# Patient Record
Sex: Male | Born: 1995
Health system: Southern US, Community
[De-identification: ages and names within clinical notes are randomized; demographics above are authoritative.]

---
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
## 2017-09-24 ENCOUNTER — Encounter (HOSPITAL_COMMUNITY): Payer: Self-pay | Admitting: Family Medicine

## 2017-09-24 ENCOUNTER — Emergency Department (HOSPITAL_COMMUNITY)
Admission: EM | Admit: 2017-09-24 | Discharge: 2017-09-24 | Disposition: A | Discharge status: Home / Self Care | Payer: Self-pay | Attending: Emergency Medicine | Admitting: Emergency Medicine

## 2017-09-24 DIAGNOSIS — F17210 Nicotine dependence, cigarettes, uncomplicated: Secondary | ICD-10-CM | POA: Insufficient documentation

## 2017-09-24 DIAGNOSIS — F1210 Cannabis abuse, uncomplicated: Secondary | ICD-10-CM | POA: Insufficient documentation

## 2017-09-24 DIAGNOSIS — F919 Conduct disorder, unspecified: Secondary | ICD-10-CM

## 2017-09-24 DIAGNOSIS — F989 Unspecified behavioral and emotional disorders with onset usually occurring in childhood and adolescence: Secondary | ICD-10-CM | POA: Insufficient documentation

## 2017-09-24 LAB — RAPID URINE DRUG SCREEN, HOSP PERFORMED
AMPHETAMINES: NOT DETECTED
BARBITURATES: NOT DETECTED
Benzodiazepines: NOT DETECTED
Cocaine: NOT DETECTED
OPIATES: NOT DETECTED
Tetrahydrocannabinol: POSITIVE — AB

## 2017-09-24 LAB — CBG MONITORING, ED: Glucose-Capillary: 81 mg/dL (ref 65–99)

## 2017-09-24 NOTE — Discharge Instructions (Addendum)
Follow-up with community mental health resources.  Avoid smoking marijuana.

## 2017-09-24 NOTE — ED Provider Notes (Addendum)
[facility]-EMERGENCY DEPT Provider Note   CSN: [id number] Arrival date & time: 09/24/17  1903     History   Chief Complaint Chief Complaint  Patient presents with  . Psychiatric Evaluation    HPI Brett Wu is a 22 y.o. male.  Patient reports excessive marijuana smoking recently.  He thinks that someone is laced his wheezing.  His brother states he is acting differently than usual.  No suicidal homicidal ideation noted but he has heard it can be inflated appropriately and possibly seeing things.  No previous psychiatric disorder.  He is normally healthy.  He denies other street drugs other than marijuana.  He does not appear to be actively psychotic.     History reviewed. No pertinent past medical history.  Patient Active Problem List   Diagnosis Date Noted  . Cannabis abuse with other cannabis-induced disorder (HCC) 09/27/2017  . Aggressive behavior     History reviewed. No pertinent surgical history.      Home Medications    Prior to Admission medications   Not on File    Family History History reviewed. No pertinent family history.  Social History Social History   Tobacco Use  . Smoking status: Current Every Day Smoker    Types: Cigarettes  . Smokeless tobacco: Never Used  . Tobacco comment: " A lot"   Substance Use Topics  . Alcohol use: Yes    Comment: 2-3 times a week. Last drink: today   . Drug use: Yes    Types: Marijuana    Comment: Last used: 1 week ago      Allergies   Patient has no known allergies.   Review of Systems Review of Systems  All other systems reviewed and are negative.    Physical Exam Updated Vital Signs BP 103/68 (BP Location: Left Arm)   Pulse (!) 56   Temp 97.6 F (36.4 C) (Oral)   Resp 16   Ht  (1.727 m)   Wt 72.6 kg (160 lb)   SpO2 99%   BMI 24.33 kg/m   Physical Exam  Constitutional: He is oriented to person, place, and time. He appears well-developed and  well-nourished.  HENT:  Head: Normocephalic and atraumatic.  Eyes: Conjunctivae are normal.  Neck: Neck supple.  Cardiovascular: Normal rate and regular rhythm.  Pulmonary/Chest: Effort normal and breath sounds normal.  Abdominal: Soft. Bowel sounds are normal.  Musculoskeletal: Normal range of motion.  Neurological: He is alert and oriented to person, place, and time.  Skin: Skin is warm and dry.  Psychiatric:  Flat affect; no suicidal or homicidal ideation; no obvious psychosis  Nursing note and vitals reviewed.    ED Treatments / Results  Labs (all labs ordered are listed, but only abnormal results are displayed) Labs Reviewed  RAPID URINE DRUG SCREEN, HOSP PERFORMED - Abnormal; Notable for the following components:      Result Value   Tetrahydrocannabinol POSITIVE (*)    All other components within normal limits  CBG MONITORING, ED    EKG None  Radiology No results found.  Procedures Procedures (including critical care time)  Medications Ordered in ED Medications - No data to display   Initial Impression / Assessment and Plan / ED Course  I have reviewed the triage vital signs and the nursing notes.  Pertinent labs & imaging results that were available during my care of the patient were reviewed by me and considered in my medical decision making (see chart for details).  Patient presents with altered thinking suspect secondary to marijuana smoking.  No other street drugs in his drug screen.  Glucose normal.  Behavioral Health consult obtained.  It was their opinion that patient to be treated as an outpatient.  Brother will take him home.   Final Clinical Impressions(s) / ED Diagnoses   Final diagnoses:  Behavior disturbance    ED Discharge Orders    None       Donnetta Hutching, MD 09/24/17 2223    Donnetta Hutching, MD 10/08/17 [phone]
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
## 2017-09-24 NOTE — ED Notes (Signed)
Pt from home brought to ED by older sibling d/t increased paranoia . Pt admits to smoking "pot" recently and is feeling increasingly paranoid. Denies suicidal or homcidal ideation as well as denies AV hallucinations.
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
## 2017-09-26 ENCOUNTER — Emergency Department (HOSPITAL_COMMUNITY)
Admission: EM | Admit: 2017-09-26 | Discharge: 2017-09-27 | Disposition: A | Discharge status: Home / Self Care | Payer: Self-pay | Attending: Emergency Medicine | Admitting: Emergency Medicine

## 2017-09-26 ENCOUNTER — Encounter (HOSPITAL_COMMUNITY): Payer: Self-pay | Admitting: Emergency Medicine

## 2017-09-26 DIAGNOSIS — F22 Delusional disorders: Secondary | ICD-10-CM

## 2017-09-26 DIAGNOSIS — F12188 Cannabis abuse with other cannabis-induced disorder: Secondary | ICD-10-CM

## 2017-09-26 DIAGNOSIS — R4689 Other symptoms and signs involving appearance and behavior: Secondary | ICD-10-CM

## 2017-09-26 DIAGNOSIS — F17210 Nicotine dependence, cigarettes, uncomplicated: Secondary | ICD-10-CM | POA: Insufficient documentation

## 2017-09-26 LAB — CBC WITH DIFFERENTIAL/PLATELET
Basophils Absolute: 0 10*3/uL (ref 0.0–0.1)
Basophils Relative: 1 %
EOS PCT: 1 %
Eosinophils Absolute: 0.1 10*3/uL (ref 0.0–0.7)
HCT: 44.6 % (ref 39.0–52.0)
Hemoglobin: 16.4 g/dL (ref 13.0–17.0)
LYMPHS ABS: 1.6 10*3/uL (ref 0.7–4.0)
LYMPHS PCT: 27 %
MCH: 30.9 pg (ref 26.0–34.0)
MCHC: 36.8 g/dL — AB (ref 30.0–36.0)
MCV: 84.2 fL (ref 78.0–100.0)
MONOS PCT: 10 %
Monocytes Absolute: 0.6 10*3/uL (ref 0.1–1.0)
Neutro Abs: 3.6 10*3/uL (ref 1.7–7.7)
Neutrophils Relative %: 61 %
PLATELETS: 191 10*3/uL (ref 150–400)
RBC: 5.3 MIL/uL (ref 4.22–5.81)
RDW: 12.8 % (ref 11.5–15.5)
WBC: 5.9 10*3/uL (ref 4.0–10.5)

## 2017-09-26 LAB — COMPREHENSIVE METABOLIC PANEL
ALBUMIN: 4.8 g/dL (ref 3.5–5.0)
ALK PHOS: 81 U/L (ref 38–126)
ALT: 15 U/L — ABNORMAL LOW (ref 17–63)
AST: 14 U/L — AB (ref 15–41)
Anion gap: 8 (ref 5–15)
BILIRUBIN TOTAL: 1.3 mg/dL — AB (ref 0.3–1.2)
BUN: 13 mg/dL (ref 6–20)
CALCIUM: 9.5 mg/dL (ref 8.9–10.3)
CO2: 28 mmol/L (ref 22–32)
Chloride: 105 mmol/L (ref 101–111)
Creatinine, Ser: 0.83 mg/dL (ref 0.61–1.24)
GFR calc Af Amer: >60 mL/min (ref >60)
GFR calc non Af Amer: >60 mL/min (ref >60)
GLUCOSE: 83 mg/dL (ref 65–99)
Potassium: 3.9 mmol/L (ref 3.5–5.1)
Sodium: 141 mmol/L (ref 135–145)
TOTAL PROTEIN: 6.9 g/dL (ref 6.5–8.1)

## 2017-09-26 LAB — RAPID URINE DRUG SCREEN, HOSP PERFORMED
Amphetamines: NOT DETECTED
Barbiturates: NOT DETECTED
Benzodiazepines: NOT DETECTED
Cocaine: NOT DETECTED
Opiates: NOT DETECTED
TETRAHYDROCANNABINOL: POSITIVE — AB

## 2017-09-26 LAB — ETHANOL: Alcohol, Ethyl (B): <10 mg/dL (ref <10)

## 2017-09-26 LAB — SALICYLATE LEVEL: Salicylate Lvl: <7 mg/dL (ref 2.8–30.0)

## 2017-09-26 LAB — ACETAMINOPHEN LEVEL: Acetaminophen (Tylenol), Serum: <10 ug/mL — ABNORMAL LOW (ref 10–30)
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
## 2017-09-26 NOTE — ED Notes (Signed)
Pts brother sts that the father is not a reliable person to contact in case of any emergency and that in case of emergency Brother [place] would like to be called.

## 2017-09-26 NOTE — ED Notes (Signed)
Pt currently sleeping; respirations regular and unlabored. No distress noted.

## 2017-09-26 NOTE — ED Notes (Signed)
Bed: [facility] Expected date:  Expected time:  Means of arrival:  Comments: Hold for [employer]
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
## 2017-09-27 DIAGNOSIS — R4689 Other symptoms and signs involving appearance and behavior: Secondary | ICD-10-CM | POA: Insufficient documentation

## 2017-09-27 DIAGNOSIS — F12188 Cannabis abuse with other cannabis-induced disorder: Secondary | ICD-10-CM | POA: Diagnosis present

## 2017-09-27 DIAGNOSIS — F17210 Nicotine dependence, cigarettes, uncomplicated: Secondary | ICD-10-CM

## 2017-09-27 NOTE — ED Notes (Signed)
Pt discharged home. Discharged instructions read to pt who verbalized understanding. All belongings returned to pt who signed for same. Denies SI/HI, is not delusional and not responding to internal stimuli. Escorted pt to the ED exit.    

## 2017-09-27 NOTE — [facility]HH Suicide Risk Assessment (Signed)
Suicide Risk Assessment  Discharge Assessment   [facility] Discharge Suicide Risk Assessment   Principal Problem: Cannabis abuse with other cannabis-induced disorder [facility]) Discharge Diagnoses:  Patient Active Problem List   Diagnosis Date Noted  . Cannabis abuse with other cannabis-induced disorder [facility]) [F12.188] 09/27/2017    Priority: High  . Aggressive behavior [R46.89]     Total Time spent with patient: 45 minutes  Musculoskeletal: Strength & Muscle Tone: within normal limits Gait & Station: normal Patient leans: N/A  Psychiatric Specialty Exam: Physical Exam  Constitutional: He is oriented to person, place, and time. He appears well-developed and well-nourished.  HENT:  Head: Normocephalic.  Neck: Normal range of motion.  Respiratory: Effort normal.  Musculoskeletal: Normal range of motion.  Neurological: He is alert and oriented to person, place, and time.  Psychiatric: He has a normal mood and affect. His speech is normal and behavior is normal. Judgment and thought content normal. Cognition and memory are normal.    Review of Systems  Psychiatric/Behavioral: Positive for substance abuse.  All other systems reviewed and are negative.   Blood pressure (!) 126/56, pulse 67, temperature 98.3 F (36.8 C), temperature source Oral, resp. rate 18, SpO2 100 %.There is no height or weight on file to calculate BMI.  General Appearance: Casual  Eye Contact:  Good  Speech:  Normal Rate  Volume:  Normal  Mood:  Euthymic  Affect:  Congruent  Thought Process:  Coherent and Descriptions of Associations: Intact  Orientation:  Full (Time, Place, and Person)  Thought Content:  WDL and Logical  Suicidal Thoughts:  No  Homicidal Thoughts:  No  Memory:  Immediate;   Good Recent;   Good Remote;   Good  Judgement:  Fair  Insight:  Fair  Psychomotor Activity:  Normal  Concentration:  Concentration: Good and Attention Span: Good  Recall:  Good  Fund of Knowledge:  Good  Language:   Fair  Akathisia:  No  Handed:  Right  AIMS (if indicated):     Assets:  Leisure Time Physical Health Resilience Social Support Vocational/Educational  ADL's:  Intact  Cognition:  WNL  Sleep:      Mental Status Per Nursing Assessment::   On Admission:   cannabis abuse with aggressin  Demographic Factors:  Male, Adolescent or young adult and Caucasian  Loss Factors: NA  Historical Factors: NA  Risk Reduction Factors:   Sense of responsibility to family, Living with another person, especially a relative and Positive social support  Continued Clinical Symptoms:  None  Cognitive Features That Contribute To Risk:  None    Suicide Risk:  Minimal: No identifiable suicidal ideation.  Patients presenting with no risk factors but with morbid ruminations; may be classified as minimal risk based on the severity of the depressive symptoms    Plan Of Care/Follow-up recommendations:  Activity:  as tolerated Diet:  heart healthy diet  Gibran Veselka, Catha Nottingham, NP 09/27/2017, 4:43 PM
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
## 2019-04-30 ENCOUNTER — Encounter (HOSPITAL_COMMUNITY): Payer: Self-pay

## 2019-04-30 ENCOUNTER — Other Ambulatory Visit: Payer: Self-pay

## 2019-04-30 ENCOUNTER — Emergency Department (HOSPITAL_COMMUNITY)
Admission: EM | Admit: 2019-04-30 | Discharge: 2019-05-02 | Disposition: A | Discharge status: Other Acute Inpatient Hospital | Payer: Self-pay | Attending: Emergency Medicine | Admitting: Emergency Medicine

## 2019-04-30 DIAGNOSIS — Z20828 Contact with and (suspected) exposure to other viral communicable diseases: Secondary | ICD-10-CM | POA: Insufficient documentation

## 2019-04-30 DIAGNOSIS — F1914 Other psychoactive substance abuse with psychoactive substance-induced mood disorder: Secondary | ICD-10-CM | POA: Insufficient documentation

## 2019-04-30 DIAGNOSIS — F17210 Nicotine dependence, cigarettes, uncomplicated: Secondary | ICD-10-CM | POA: Insufficient documentation

## 2019-04-30 DIAGNOSIS — F22 Delusional disorders: Secondary | ICD-10-CM | POA: Insufficient documentation

## 2019-04-30 LAB — COMPREHENSIVE METABOLIC PANEL
ALT: 62 U/L — ABNORMAL HIGH (ref 0–44)
AST: 55 U/L — ABNORMAL HIGH (ref 15–41)
Albumin: 5 g/dL (ref 3.5–5.0)
Alkaline Phosphatase: 84 U/L (ref 38–126)
Anion gap: 19 — ABNORMAL HIGH (ref 5–15)
BUN: 11 mg/dL (ref 6–20)
CO2: 21 mmol/L — ABNORMAL LOW (ref 22–32)
Calcium: 9.7 mg/dL (ref 8.9–10.3)
Chloride: 99 mmol/L (ref 98–111)
Creatinine, Ser: 0.92 mg/dL (ref 0.61–1.24)
GFR calc Af Amer: >60 mL/min (ref >60)
GFR calc non Af Amer: >60 mL/min (ref >60)
Glucose, Bld: 113 mg/dL — ABNORMAL HIGH (ref 70–99)
Potassium: 3.2 mmol/L — ABNORMAL LOW (ref 3.5–5.1)
Sodium: 139 mmol/L (ref 135–145)
Total Bilirubin: 1.8 mg/dL — ABNORMAL HIGH (ref 0.3–1.2)
Total Protein: 7.9 g/dL (ref 6.5–8.1)

## 2019-04-30 LAB — CBC WITH DIFFERENTIAL/PLATELET
Abs Immature Granulocytes: 0.06 10*3/uL (ref 0.00–0.07)
Basophils Absolute: 0.1 10*3/uL (ref 0.0–0.1)
Basophils Relative: 1 %
Eosinophils Absolute: 0.1 10*3/uL (ref 0.0–0.5)
Eosinophils Relative: 0 %
HCT: 48.2 % (ref 39.0–52.0)
Hemoglobin: 16.6 g/dL (ref 13.0–17.0)
Immature Granulocytes: 0 %
Lymphocytes Relative: 16 %
Lymphs Abs: 2.4 10*3/uL (ref 0.7–4.0)
MCH: 29.3 pg (ref 26.0–34.0)
MCHC: 34.4 g/dL (ref 30.0–36.0)
MCV: 85.2 fL (ref 80.0–100.0)
Monocytes Absolute: 1.5 10*3/uL — ABNORMAL HIGH (ref 0.1–1.0)
Monocytes Relative: 10 %
Neutro Abs: 10.7 10*3/uL — ABNORMAL HIGH (ref 1.7–7.7)
Neutrophils Relative %: 73 %
Platelets: 277 10*3/uL (ref 150–400)
RBC: 5.66 MIL/uL (ref 4.22–5.81)
RDW: 13 % (ref 11.5–15.5)
WBC: 14.9 10*3/uL — ABNORMAL HIGH (ref 4.0–10.5)
nRBC: 0 % (ref 0.0–0.2)

## 2019-04-30 LAB — RESPIRATORY PANEL BY RT PCR (FLU A&B, COVID)
Influenza A by PCR: NEGATIVE
Influenza B by PCR: NEGATIVE
SARS Coronavirus 2 by RT PCR: NEGATIVE

## 2019-04-30 LAB — RAPID URINE DRUG SCREEN, HOSP PERFORMED
Amphetamines: POSITIVE — AB
Barbiturates: NOT DETECTED
Benzodiazepines: NOT DETECTED
Cocaine: NOT DETECTED
Opiates: NOT DETECTED
Tetrahydrocannabinol: POSITIVE — AB

## 2019-04-30 LAB — ETHANOL: Alcohol, Ethyl (B): <10 mg/dL (ref <10)

## 2019-04-30 MED ORDER — ZIPRASIDONE MESYLATE 20 MG IM SOLR
20.0000 mg | Freq: Once | INTRAMUSCULAR | Status: AC
  Administered 2019-04-30: 02:00:00 20 mg via INTRAMUSCULAR
  Filled 2019-04-30: qty 20

## 2019-04-30 MED ORDER — OLANZAPINE 5 MG PO TBDP: 5.0000 mg | ORAL_TABLET | Freq: Once | ORAL | Status: DC

## 2019-04-30 MED ORDER — ZIPRASIDONE MESYLATE 20 MG IM SOLR
20.0000 mg | Freq: Once | INTRAMUSCULAR | Status: AC
  Administered 2019-04-30: 09:00:00 20 mg via INTRAMUSCULAR
  Filled 2019-04-30: qty 20

## 2019-04-30 MED ORDER — IBUPROFEN 200 MG PO TABS
600.0000 mg | ORAL_TABLET | Freq: Four times a day (QID) | ORAL | Status: DC | PRN
  Administered 2019-04-30 – 2019-05-02 (×2): 600 mg via ORAL
  Filled 2019-04-30 (×2): qty 3

## 2019-04-30 MED ORDER — OLANZAPINE 10 MG IM SOLR
10.0000 mg | Freq: Once | INTRAMUSCULAR | Status: AC
  Administered 2019-04-30: 21:00:00 10 mg via INTRAMUSCULAR
  Filled 2019-04-30: qty 10

## 2019-04-30 MED ORDER — POTASSIUM CHLORIDE CRYS ER 20 MEQ PO TBCR
40.0000 meq | EXTENDED_RELEASE_TABLET | Freq: Once | ORAL | Status: AC
  Administered 2019-04-30: 09:00:00 40 meq via ORAL
  Filled 2019-04-30: qty 2

## 2019-04-30 MED ORDER — STERILE WATER FOR INJECTION IJ SOLN
INTRAMUSCULAR | Status: AC
  Administered 2019-04-30: 10 mL
  Filled 2019-04-30: qty 10

## 2019-04-30 MED ORDER — OLANZAPINE 10 MG PO TBDP
10.0000 mg | ORAL_TABLET | Freq: Once | ORAL | Status: AC
  Administered 2019-04-30: 10:00:00 10 mg via ORAL
  Filled 2019-04-30: qty 1

## 2019-04-30 MED ORDER — DIPHENHYDRAMINE HCL 50 MG/ML IJ SOLN
25.0000 mg | Freq: Once | INTRAMUSCULAR | Status: AC
  Administered 2019-04-30: 21:00:00 25 mg via INTRAMUSCULAR
  Filled 2019-04-30: qty 1

## 2019-04-30 MED ORDER — STERILE WATER FOR INJECTION IJ SOLN
INTRAMUSCULAR | Status: AC
  Filled 2019-04-30: qty 10

## 2019-04-30 NOTE — BH Assessment (Addendum)
[facility] Assessment Progress Note  Per Letitia Libra, FNP, this pt requires psychiatric hospitalization at this time.  Pt presents under IVC initiated by law enforcement, which EDP Addison Lank, MD has upheld.  The following facilities have been contacted to seek placement for this pt, with results as noted:  Beds available, information sent, decision pending: [facility]  At capacity: [place], [place] Coordinator [phone]

## 2019-04-30 NOTE — ED Notes (Signed)
Pt here with police. Pt refusing triage or vitals. IVC papers en route per [employer].

## 2019-04-30 NOTE — ED Notes (Signed)
Pt sleeping comfortably.

## 2019-04-30 NOTE — ED Notes (Signed)
Pt is continuously being very inappropriate, yelling and cursing out staff.

## 2019-04-30 NOTE — ED Notes (Signed)
Pt awake,  Irritated . Delusional, continues to talk about brother being killed. , wants police to go to grandmother house, pt redirected and reassured.

## 2019-04-30 NOTE — ED Notes (Signed)
Hyper verbal and loud. Does respond to redirection but his behavior change only lasts seconds and needs to be prompted again. He said all he wanted to do is listen to music. Gave him the TV guide to pick a channel. Unable to focus but with assist picked [employer] and is quiet in his room at this time watching it. He came out of his room briefly to ask for his grandfathers number. Writer doesn't have it nor is it listed in the computer. He states he spoke to a Engineer, structural and he was sending someone out to his house to get it. This is not information I am unaware of. I agreed to let him know if the officer returned.
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
## 2019-04-30 NOTE — ED Notes (Addendum)
Pt is very uncooperative. Staff has asked the pt to go to his room several times because he is extremely loud, verbally aggressive, and inappropriate towards staff. Pt continues to ignore instructions

## 2019-04-30 NOTE — ED Notes (Signed)
Pt agitated, banging on glass, not redirectable.  Yelling at nurse.

## 2019-04-30 NOTE — ED Notes (Signed)
Pt tore off the doors of his room

## 2019-04-30 NOTE — ED Notes (Addendum)
Pt. Refused lab draw.pt. stated, " No,That's okay because I'm not doing that shit." RN, Maylon Cos made aware.

## 2019-04-30 NOTE — BH Assessment (Signed)
[facility] Assessment Progress Note   Per Hall Busing NP patient is recommended for a inpatient admission.

## 2019-04-30 NOTE — ED Notes (Signed)
1 Pt Belonging Bag located in [place]

## 2019-04-30 NOTE — BHH Counselor (Signed)
Pt unable to be seen at this time, per nurse Lanny Hurst. TTS to complete assessment once pt is up and able to complete assessment thank you!

## 2019-04-30 NOTE — ED Notes (Signed)
Had been asleep but now to the nurses station to ask if we were ever able to get ahold of his grandpa. The security officer he talked to earlier about his concern was on the unit about a half hour ago and asked this on patients behalf. States he notified [employer] to make a visit to his home and ask someone there for his number but he has not heard back from [employer]. Suspects [employer] busy and made home visit a low priority. This information was given to him. He accepted it and returned to bed.

## 2019-04-30 NOTE — BH Assessment (Addendum)
04/30/19: Per Jonelle Sidle, patient accepted to [facility] 05/01/2019 after 10am. The accepting is Jonelle Sports, MD. Nursing report number is [phone]. [facility] nursing staff updated.
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
## 2019-04-30 NOTE — BH Assessment (Signed)
Per Hall Busing NP patient is recommended for a inpatient admission. Patient referred to the following hospitals (pending review):   [facility] Details       [facility] Details       [facility] Details       [facility] Details       [facility] Details       [facility] Details       [facility] Details       [facility] Details       [facility] Details       [facility] Details       [facility] Details       [facility] Details       [facility] Details       [place] Details       [facility] Details       [facility] Details       [facility] Details       [facility]

## 2019-04-30 NOTE — ED Notes (Signed)
Pt awake, pt given lunch.  Pt asked if leaving tomorrow.

## 2019-04-30 NOTE — ED Notes (Signed)
Pt anxious, wanting to leave, talking about brother being killed and rambling.

## 2019-04-30 NOTE — ED Notes (Signed)
Pt continues to be non compliant with staff. Still awaiting IVC papers. Pt is in no obvious distress at this time.

## 2019-04-30 NOTE — ED Notes (Signed)
Pt refuses to keep mask on

## 2019-04-30 NOTE — ED Triage Notes (Addendum)
Pt BIB [employer]. He is under IVC at this time. Pt states that the devil speaks to him and he is suicidal. Hx schizophrenia. Combative and noncompliant.

## 2019-04-30 NOTE — ED Notes (Signed)
Complained of pain in his right hand, wrist. States earlier today he got upset and hit a wall which his why he says his hand hurts. Gave him ordered Ibuprofen prn for pain, he is unable to organized to tell me a pain level. When I gave him the med it was three pills which he said was a good thing, three has significance to him but unable to determine what the significance is.

## 2019-04-30 NOTE — ED Notes (Signed)
Aggitated, upset, tearful, anxious, delsuional

## 2019-04-30 NOTE — Progress Notes (Signed)
Received Dontel from the main ED, he was oriented ro his new environment and allowed his VS to be taken. He was given a snack of his choice. The sitter is at the bedside. He is calm at the present time. He had an episode of vomiting after his snack with large chucks of food. He was sleeping between eating his sandwich.

## 2019-04-30 NOTE — ED Notes (Signed)
Asked for something to eat and warmed his dinner he did not eat earlier. He has a one time order for Zyprexa and Benadryl. He continues to be loud, talkative frequently to nurses station and focused on his tattoo and his brothers death and his plan to retaliate to avenge his brothers death. He is more focused now than at the start of the shift. He took the injection without any difficult. A few minutes later he asked writer if that was his last shot and sought reassurance he would be leaving tomorrow. He has no other medicines ordered at this time so he was told it would be his last shot but writer vague on him leaving in the am, answering only I wasn't informed of his plans for the am. He is however scheduled to go to [facility] in the am. Did not want to agitate him so did not clearly answer his question about him leaving in the am because he is assuming he will be discharged home in the am.

## 2019-05-01 MED ORDER — HALOPERIDOL LACTATE 5 MG/ML IJ SOLN
10.0000 mg | Freq: Once | INTRAMUSCULAR | Status: AC
  Administered 2019-05-01: 10 mg via INTRAMUSCULAR
  Filled 2019-05-01: qty 2

## 2019-05-01 MED ORDER — NICOTINE 21 MG/24HR TD PT24
21.0000 mg | MEDICATED_PATCH | Freq: Every day | TRANSDERMAL | Status: DC
  Administered 2019-05-01: 21 mg via TRANSDERMAL
  Filled 2019-05-01: qty 1

## 2019-05-01 MED ORDER — ZIPRASIDONE MESYLATE 20 MG IM SOLR
20.0000 mg | Freq: Once | INTRAMUSCULAR | Status: AC
  Administered 2019-05-01: 08:00:00 20 mg via INTRAMUSCULAR
  Filled 2019-05-01: qty 20

## 2019-05-01 MED ORDER — STERILE WATER FOR INJECTION IJ SOLN
INTRAMUSCULAR | Status: AC
  Administered 2019-05-01: 10 mL
  Filled 2019-05-01: qty 10

## 2019-05-01 NOTE — ED Notes (Signed)
Back up to nurses station saying in a few hours he is going to need his shoes and stuff. Staff informed him there was time enough to get his personal effects together if it is decided he go home, the decision is not up to this Probation officer. He responded "I sure am fuck am going home this morning, fuck this shit" and walked back to his room.

## 2019-05-01 NOTE — ED Notes (Signed)
Pt released from seclusion.  Pt walked to room.  Pt laid down.

## 2019-05-01 NOTE — Patient Outreach (Signed)
[facility] will follow up with Pt when he wakes up.

## 2019-05-01 NOTE — ED Notes (Signed)
Been up to the nurses station two more times both times to ask about his grandfathers number. Reassured he had enough time in am for arrangements to be made if necessary.

## 2019-05-01 NOTE — ED Notes (Signed)
Pt uncooperative, threatening, combative with staff.

## 2019-05-01 NOTE — ED Notes (Signed)
Called to give report to [facility]. Pt can not go to [facility] for 24 hrs bc given IM for behavioral.  Pt can go to [facility] in AM.

## 2019-05-01 NOTE — ED Notes (Signed)
Pt resting comfortably

## 2019-05-02 MED ORDER — DIPHENHYDRAMINE HCL 50 MG/ML IJ SOLN
50.0000 mg | Freq: Once | INTRAMUSCULAR | Status: AC
  Administered 2019-05-02: 50 mg via INTRAMUSCULAR
  Filled 2019-05-02: qty 1

## 2019-05-02 MED ORDER — LORAZEPAM 2 MG/ML IJ SOLN
2.0000 mg | Freq: Once | INTRAMUSCULAR | Status: AC
  Administered 2019-05-02: 2 mg via INTRAMUSCULAR
  Filled 2019-05-02: qty 1

## 2019-05-02 NOTE — ED Notes (Signed)
[facility] again for transport - no answer - left message.

## 2019-05-02 NOTE — ED Notes (Signed)
Called sheriff for transport - left message.

## 2019-05-02 NOTE — ED Notes (Signed)
Transported to [facility] by [facility]. All belongings given to [facility]. PT was cooperative with the transport.

## 2019-05-02 NOTE — ED Notes (Signed)
Pt developed EPS with neck stiffness, difficulty speaking and snoring while breathing. Injection of benadryl and Ativan corrected immediately.

## 2019-05-02 NOTE — ED Notes (Addendum)
[place] [facility]OCKER [phone]

## 2019-06-25 ENCOUNTER — Other Ambulatory Visit: Payer: Self-pay

## 2019-06-25 ENCOUNTER — Emergency Department (HOSPITAL_COMMUNITY)
Admission: EM | Admit: 2019-06-25 | Discharge: 2019-06-25 | Disposition: A | Discharge status: Home / Self Care | Payer: Self-pay | Attending: Emergency Medicine | Admitting: Emergency Medicine

## 2019-06-25 ENCOUNTER — Encounter (HOSPITAL_COMMUNITY): Payer: Self-pay

## 2019-06-25 DIAGNOSIS — F209 Schizophrenia, unspecified: Secondary | ICD-10-CM | POA: Insufficient documentation

## 2019-06-25 DIAGNOSIS — Z79899 Other long term (current) drug therapy: Secondary | ICD-10-CM | POA: Insufficient documentation

## 2019-06-25 DIAGNOSIS — F17210 Nicotine dependence, cigarettes, uncomplicated: Secondary | ICD-10-CM | POA: Insufficient documentation

## 2019-06-25 DIAGNOSIS — F1210 Cannabis abuse, uncomplicated: Secondary | ICD-10-CM | POA: Insufficient documentation

## 2019-06-25 NOTE — ED Triage Notes (Signed)
Patient coming from home and states that "I have a demon inside of me". Patient denies any thoughts of harming himself or others at this time. Patient is also saying that "the demon eats all of my food in my stomach and then I poop it out" and says that he has visions of the future that come true. Patient lives at home with his grandfather.

## 2019-06-25 NOTE — ED Provider Notes (Signed)
[facility]-EMERGENCY DEPT Provider Note   CSN: [id number] Arrival date & time: 06/25/19  2152     History Chief Complaint  Patient presents with  . Mental Health Problem    Brett Wu is a 24 y.o. male.  24 year old male with prior medical history as detailed below presents for evaluation.  Patient reports that he "has a demon inside him."  Additional questions about the origin of the demon or how long the demon has been present are not answered by the patient.  Patient does report extensive use of marijuana on a daily basis.  He denies recent use of alcohol, cocaine, or other drugs.  Patient does report a prior history of schizophrenia.  Patient cannot definitively report compliance with his prescribed medications  The history is provided by the patient and medical records.  Mental Health Problem Presenting symptoms: delusional   Degree of incapacity (severity):  Mild Onset quality:  Unable to specify Timing:  Constant Progression:  Unchanged Chronicity:  Recurrent Context: drug abuse   Treatment compliance:  Unable to specify Relieved by:  Nothing Worsened by:  Nothing      No past medical history on file.  Patient Active Problem List   Diagnosis Date Noted  . Cannabis abuse with other cannabis-induced disorder (HCC) 09/27/2017  . Aggressive behavior     No past surgical history on file.     No family history on file.  Social History   Tobacco Use  . Smoking status: Current Every Day Smoker    Types: Cigarettes  . Smokeless tobacco: Never Used  . Tobacco comment: " A lot"   Substance Use Topics  . Alcohol use: Yes    Comment: 2-3 times a week. Last drink: today   . Drug use: Yes    Types: Marijuana    Comment: Last used: 1 week ago     Home Medications Prior to Admission medications   Medication Sig Start Date End Date Taking? Authorizing Provider  OLANZapine (ZYPREXA) 10 MG tablet Take 10 mg by mouth at bedtime. 05/27/19    [provider]    Allergies    Patient has no known allergies.  Review of Systems   Review of Systems  All other systems reviewed and are negative.   Physical Exam Updated Vital Signs BP (!) 148/104 (BP Location: Right Arm)   Pulse 74   Temp 98 F (36.7 C) (Oral)   Resp 18   SpO2 98%   Physical Exam Vitals and nursing note reviewed.  Constitutional:      General: He is not in acute distress.    Appearance: Normal appearance. He is well-developed.  HENT:     Head: Normocephalic and atraumatic.  Eyes:     Conjunctiva/sclera: Conjunctivae normal.     Pupils: Pupils are equal, round, and reactive to light.  Cardiovascular:     Rate and Rhythm: Normal rate and regular rhythm.     Heart sounds: Normal heart sounds.  Pulmonary:     Effort: Pulmonary effort is normal. No respiratory distress.     Breath sounds: Normal breath sounds.  Abdominal:     General: There is no distension.     Palpations: Abdomen is soft.     Tenderness: There is no abdominal tenderness.  Musculoskeletal:        General: No deformity. Normal range of motion.     Cervical back: Normal range of motion and neck supple.  Skin:    General: Skin  is warm and dry.  Neurological:     General: No focal deficit present.     Mental Status: He is alert and oriented to person, place, and time. Mental status is at baseline.  Psychiatric:     Comments: Appears delusional - reports that a "demon is inside me"     ED Results / Procedures / Treatments   Labs (all labs ordered are listed, but only abnormal results are displayed) Labs Reviewed  ACETAMINOPHEN LEVEL  BASIC METABOLIC PANEL  CBC WITH DIFFERENTIAL/PLATELET  SALICYLATE LEVEL  RAPID URINE DRUG SCREEN, HOSP PERFORMED  ETHANOL    EKG None  Radiology No results found.  Procedures Procedures (including critical care time)  Medications Ordered in ED Medications - No data to display  ED Course  I have reviewed the triage vital  signs and the nursing notes.  Pertinent labs & imaging results that were available during my care of the patient were reviewed by me and considered in my medical decision making (see chart for details).    MDM Rules/Calculators/A&P                      MDM  Screen complete  BROGEN DUELL was evaluated in Emergency Department on 06/25/2019 for the symptoms described in the history of present illness. He was evaluated in the context of the global COVID-19 pandemic, which necessitated consideration that the patient might be at risk for infection with the SARS-CoV-2 virus that causes COVID-19. Institutional protocols and algorithms that pertain to the evaluation of patients at risk for COVID-19 are in a state of rapid change based on information released by regulatory bodies including the CDC and federal and state organizations. These policies and algorithms were followed during the patient's care in the ED.  Patient initially presented with complaint of having "having a demon inside."  Patient did not endorse suicidal ideation or homicidal ideation.  Although he endorsed mild delusions he is directable and able to make his own decisions.   Shortly after the initial evaluation the patient did state that he did not want to be present.  He said he wanted to go home. He declined further ED evaluation.  Patient declined screening labs prior to psychiatric evaluation.  Patient did not meet criteria for IVC.  Patient was allowed to leave under his own recognizance.   Importance of close follow-up was stressed.  Strict precautions given and understood.     Final Clinical Impression(s) / ED Diagnoses Final diagnoses:  Schizophrenia, unspecified type [facility])    Rx / DC Orders ED Discharge Orders    None       Valarie Merino, MD 06/25/19 2359

## 2019-06-25 NOTE — ED Notes (Signed)
This RN went into patient's room to draw blood. Patient is becoming increasingly anxious and says he wishes to leave. Dr. Rodena Medin discussed discharge with patient. Patient verbalized understanding of discharge, but left before paperwork was finished.

## 2019-06-25 NOTE — Discharge Instructions (Addendum)
Please return for any problem.   Follow up with your regular care providers as instructed.  

## 2019-10-21 ENCOUNTER — Inpatient Hospital Stay (HOSPITAL_COMMUNITY)
Admission: EM | Admit: 2019-10-21 | Discharge: 2019-10-26 | DRG: 603 | Disposition: A | Discharge status: Home / Self Care | Payer: Self-pay | Attending: Internal Medicine | Admitting: Internal Medicine

## 2019-10-21 ENCOUNTER — Other Ambulatory Visit: Payer: Self-pay

## 2019-10-21 ENCOUNTER — Encounter (HOSPITAL_COMMUNITY): Payer: Self-pay | Admitting: Emergency Medicine

## 2019-10-21 DIAGNOSIS — E872 Acidosis, unspecified: Secondary | ICD-10-CM | POA: Diagnosis present

## 2019-10-21 DIAGNOSIS — L03211 Cellulitis of face: Principal | ICD-10-CM | POA: Diagnosis present

## 2019-10-21 DIAGNOSIS — B9562 Methicillin resistant Staphylococcus aureus infection as the cause of diseases classified elsewhere: Secondary | ICD-10-CM | POA: Diagnosis present

## 2019-10-21 DIAGNOSIS — F17210 Nicotine dependence, cigarettes, uncomplicated: Secondary | ICD-10-CM | POA: Diagnosis present

## 2019-10-21 DIAGNOSIS — Z20822 Contact with and (suspected) exposure to covid-19: Secondary | ICD-10-CM | POA: Diagnosis present

## 2019-10-21 DIAGNOSIS — E860 Dehydration: Secondary | ICD-10-CM

## 2019-10-21 DIAGNOSIS — L0201 Cutaneous abscess of face: Secondary | ICD-10-CM

## 2019-10-21 LAB — COMPREHENSIVE METABOLIC PANEL
ALT: 106 U/L — ABNORMAL HIGH (ref 0–44)
AST: 36 U/L (ref 15–41)
Albumin: 3.8 g/dL (ref 3.5–5.0)
Alkaline Phosphatase: 116 U/L (ref 38–126)
Anion gap: 11 (ref 5–15)
BUN: <5 mg/dL — ABNORMAL LOW (ref 6–20)
CO2: 24 mmol/L (ref 22–32)
Calcium: 8.9 mg/dL (ref 8.9–10.3)
Chloride: 98 mmol/L (ref 98–111)
Creatinine, Ser: 0.92 mg/dL (ref 0.61–1.24)
GFR calc Af Amer: >60 mL/min (ref >60)
GFR calc non Af Amer: >60 mL/min (ref >60)
Glucose, Bld: 248 mg/dL — ABNORMAL HIGH (ref 70–99)
Potassium: 3.6 mmol/L (ref 3.5–5.1)
Sodium: 133 mmol/L — ABNORMAL LOW (ref 135–145)
Total Bilirubin: 1.6 mg/dL — ABNORMAL HIGH (ref 0.3–1.2)
Total Protein: 6.9 g/dL (ref 6.5–8.1)

## 2019-10-21 LAB — CBC WITH DIFFERENTIAL/PLATELET
Abs Immature Granulocytes: 0.03 10*3/uL (ref 0.00–0.07)
Basophils Absolute: 0 10*3/uL (ref 0.0–0.1)
Basophils Relative: 0 %
Eosinophils Absolute: 0.1 10*3/uL (ref 0.0–0.5)
Eosinophils Relative: 1 %
HCT: 49.1 % (ref 39.0–52.0)
Hemoglobin: 17.1 g/dL — ABNORMAL HIGH (ref 13.0–17.0)
Immature Granulocytes: 0 %
Lymphocytes Relative: 9 %
Lymphs Abs: 0.9 10*3/uL (ref 0.7–4.0)
MCH: 31.7 pg (ref 26.0–34.0)
MCHC: 34.8 g/dL (ref 30.0–36.0)
MCV: 90.9 fL (ref 80.0–100.0)
Monocytes Absolute: 0.5 10*3/uL (ref 0.1–1.0)
Monocytes Relative: 5 %
Neutro Abs: 8.3 10*3/uL — ABNORMAL HIGH (ref 1.7–7.7)
Neutrophils Relative %: 85 %
Platelets: 179 10*3/uL (ref 150–400)
RBC: 5.4 MIL/uL (ref 4.22–5.81)
RDW: 12.4 % (ref 11.5–15.5)
WBC: 9.8 10*3/uL (ref 4.0–10.5)
nRBC: 0 % (ref 0.0–0.2)

## 2019-10-21 LAB — PROTIME-INR
INR: 1 (ref 0.8–1.2)
Prothrombin Time: 13.2 seconds (ref 11.4–15.2)

## 2019-10-21 LAB — URINALYSIS, ROUTINE W REFLEX MICROSCOPIC
Bacteria, UA: NONE SEEN
Bilirubin Urine: NEGATIVE
Glucose, UA: >=500 mg/dL — AB
Hgb urine dipstick: NEGATIVE
Ketones, ur: NEGATIVE mg/dL
Leukocytes,Ua: NEGATIVE
Nitrite: NEGATIVE
Protein, ur: NEGATIVE mg/dL
Specific Gravity, Urine: 1.007 (ref 1.005–1.030)
pH: 6 (ref 5.0–8.0)

## 2019-10-21 LAB — LACTIC ACID, PLASMA: Lactic Acid, Venous: 2.3 mmol/L (ref 0.5–1.9)

## 2019-10-21 MED ORDER — HYDROMORPHONE HCL 1 MG/ML IJ SOLN
1.0000 mg | Freq: Once | INTRAMUSCULAR | Status: AC
  Administered 2019-10-21: 1 mg via INTRAVENOUS
  Filled 2019-10-21: qty 1

## 2019-10-21 MED ORDER — SODIUM CHLORIDE 0.9 % IV BOLUS
1000.0000 mL | Freq: Once | INTRAVENOUS | Status: AC
  Administered 2019-10-21: 1000 mL via INTRAVENOUS

## 2019-10-21 MED ORDER — ONDANSETRON HCL 4 MG/2ML IJ SOLN
4.0000 mg | Freq: Once | INTRAMUSCULAR | Status: AC
  Administered 2019-10-21: 4 mg via INTRAVENOUS
  Filled 2019-10-21: qty 2

## 2019-10-21 MED ORDER — VANCOMYCIN HCL 1500 MG/300ML IV SOLN
1500.0000 mg | Freq: Two times a day (BID) | INTRAVENOUS | Status: DC
  Administered 2019-10-21 – 2019-10-22 (×3): 1500 mg via INTRAVENOUS
  Filled 2019-10-21 (×5): qty 300

## 2019-10-21 MED ORDER — MORPHINE SULFATE (PF) 4 MG/ML IV SOLN
4.0000 mg | Freq: Once | INTRAVENOUS | Status: AC
  Administered 2019-10-21: 4 mg via INTRAVENOUS
  Filled 2019-10-21: qty 1

## 2019-10-21 NOTE — Progress Notes (Signed)
Pharmacy Antibiotic Note  Brett Wu is a 24 y.o. male admitted on 10/21/2019 with cellulitis.  Pharmacy has been consulted for vancomycin dosing. Pt is afebrile and WBC is WNL. SCr is WNL. Lactic acid is >2.   Plan: Vancomycin 1500mg  IV Q12H F/u renal fxn, C&S, clinical status and trough at SS  Height: 5\' 8"  (172.7 cm) Weight: 81.6 kg (180 lb) (per last visit) IBW/kg (Calculated) : 68.4  Temp (24hrs), Avg:98.9 F (37.2 C), Min:98.9 F (37.2 C), Max:98.9 F (37.2 C)  Recent Labs  Lab 10/21/19 1954  WBC 9.8  CREATININE 0.92  LATICACIDVEN 2.3*    Estimated Creatinine Clearance: 121.8 mL/min (by C-G formula based on SCr of 0.92 mg/dL).    No Known Allergies  Antimicrobials this admission: Vanc 6/10>>  Dose adjustments this admission: N/A  Microbiology results: Pending  Thank you for allowing pharmacy to be a part of this patient's care.  Seven [place], 10/21/2019 10:30 PM

## 2019-10-21 NOTE — ED Provider Notes (Addendum)
[facility] EMERGENCY DEPARTMENT Provider Note   CSN: [id number] Arrival date & time: 10/21/19  1905     History Chief Complaint  Patient presents with  . Abscess    JENS SIEMS is a 24 y.o. male.  HPI      AUSTEN OYSTER is a 24 y.o. male, presenting to the ED with concern for abscess to the chin that he states began with pain and swelling to the chin first noted about 4 days ago. Pain is sharp, throbbing, severe, radiating posteriorly along the jaw bilaterally and into the neck.  He also endorses nausea and one episode of nonbloody, nonbilious emesis. Denies immune compromise.  Denies fever/chills, throat swelling, difficulty breathing, difficulty swallowing, intraoral swelling, or any other complaints.   History reviewed. No pertinent past medical history.  Patient Active Problem List   Diagnosis Date Noted  . Cellulitis of face 10/22/2019  . Abscess of chin 10/22/2019  . Nicotine dependence, cigarettes, uncomplicated 10/22/2019  . Lactic acidosis 10/22/2019  . Cannabis abuse with other cannabis-induced disorder (HCC) 09/27/2017  . Aggressive behavior     History reviewed. No pertinent surgical history.     Family History  Problem Relation Age of Onset  . Other Neg Hx     Social History   Tobacco Use  . Smoking status: Current Every Day Smoker    Packs/day: 0.50    Types: Cigarettes  . Smokeless tobacco: Never Used  . Tobacco comment: " A lot"   Vaping Use  . Vaping Use: Never used  Substance Use Topics  . Alcohol use: Yes    Comment: 2-3 times a week. Last drink: today   . Drug use: Yes    Types: Marijuana    Comment: Last used: 1 week ago     Home Medications Prior to Admission medications   Not on File    Allergies    Patient has no known allergies.  Review of Systems   Review of Systems  Constitutional: Negative for chills, diaphoresis and fever.  HENT: Negative for trouble swallowing.   Respiratory: Negative  for shortness of breath.   Cardiovascular: Negative for chest pain.  Gastrointestinal: Positive for nausea and vomiting. Negative for abdominal pain.  Musculoskeletal: Positive for neck pain. Negative for neck stiffness.  Skin: Positive for color change.  All other systems reviewed and are negative.   Physical Exam Updated Vital Signs BP 139/82 (BP Location: Right Arm)   Pulse (!) 120   Temp 98.9 F (37.2 C) (Oral)   Resp (!) 22   SpO2 100%   Physical Exam Vitals and nursing note reviewed.  Constitutional:      General: He is not in acute distress.    Appearance: He is well-developed. He is not diaphoretic.  HENT:     Head: Normocephalic and atraumatic.     Comments: Exquisite tenderness, swelling, and erythema to the chin. Erythema and tenderness extends along the left side of the face and mandible as well as into the left side of the neck. There does appear to be purulence draining from some areas on the chin and there does seem to be a diffuse fluctuance to the chin.  Patient can fully open and close the mouth.  Handles oral secretions without noted difficulty. No noted intraoral swelling.    Mouth/Throat:     Mouth: Mucous membranes are moist.     Pharynx: Oropharynx is clear.  Eyes:     Conjunctiva/sclera: Conjunctivae normal.  Cardiovascular:  Rate and Rhythm: Regular rhythm. Tachycardia present.     Pulses: Normal pulses.          Radial pulses are 2+ on the right side and 2+ on the left side.     Heart sounds: Normal heart sounds.  Pulmonary:     Effort: Pulmonary effort is normal. No respiratory distress.     Breath sounds: Normal breath sounds.  Abdominal:     Tenderness: There is no abdominal tenderness. There is no guarding.  Musculoskeletal:     Cervical back: Neck supple. Tenderness present.  Lymphadenopathy:     Cervical: Cervical adenopathy present.  Skin:    General: Skin is warm and dry.  Neurological:     Mental Status: He is alert.    Psychiatric:        Mood and Affect: Mood and affect normal.        Speech: Speech normal.        Behavior: Behavior normal.             ED Results / Procedures / Treatments   Labs (all labs ordered are listed, but only abnormal results are displayed) Labs Reviewed  COMPREHENSIVE METABOLIC PANEL - Abnormal; Notable for the following components:      Result Value   Sodium 133 (*)    Glucose, Bld 248 (*)    BUN <5 (*)    ALT 106 (*)    Total Bilirubin 1.6 (*)    All other components within normal limits  LACTIC ACID, PLASMA - Abnormal; Notable for the following components:   Lactic Acid, Venous 2.3 (*)    All other components within normal limits  CBC WITH DIFFERENTIAL/PLATELET - Abnormal; Notable for the following components:   Hemoglobin 17.1 (*)    Neutro Abs 8.3 (*)    All other components within normal limits  URINALYSIS, ROUTINE W REFLEX MICROSCOPIC - Abnormal; Notable for the following components:   Glucose, UA >=500 (*)    All other components within normal limits  CULTURE, BLOOD (ROUTINE X 2)  CULTURE, BLOOD (ROUTINE X 2)  SARS CORONAVIRUS 2 BY RT PCR (HOSPITAL ORDER, PERFORMED IN [facility])  AEROBIC CULTURE (SUPERFICIAL SPECIMEN)  MRSA PCR SCREENING  LACTIC ACID, PLASMA  PROTIME-INR    EKG None  Radiology CT Soft Tissue Neck W Contrast  Result Date: 10/22/2019 CLINICAL DATA:  Facial cellulitis EXAM: CT NECK WITH CONTRAST TECHNIQUE: Multidetector CT imaging of the neck was performed using the standard protocol following the bolus administration of intravenous contrast. CONTRAST:  75mL OMNIPAQUE IOHEXOL 300 MG/ML  SOLN COMPARISON:  None. FINDINGS: PHARYNX AND LARYNX: The nasopharynx, oropharynx and larynx are normal. Visible portions of the oral cavity, tongue base and floor of mouth are normal. Normal epiglottis, vallecula and pyriform sinuses. The larynx is normal. No retropharyngeal abscess, effusion or lymphadenopathy. SALIVARY GLANDS:  Mildly enlarged left submandibular gland, likely reactive. Other salivary glands are normal. THYROID: Normal. LYMPH NODES: Bilateral level 1A and 1B reactive cervical lymph nodes. VASCULAR: Major cervical vessels are patent. LIMITED INTRACRANIAL: Normal. VISUALIZED ORBITS: Normal. MASTOIDS AND VISUALIZED PARANASAL SINUSES: No fluid levels or advanced mucosal thickening. No mastoid effusion. SKELETON: No bony spinal canal stenosis. No lytic or blastic lesions. UPPER CHEST: Clear. OTHER: There is heterogeneous induration of the subcutaneous tissues anterior to the left mandibular body. There is a small amount of internal intermediate density material, likely purulence. There is thickening of the platysma muscle. IMPRESSION: 1. Left facial cellulitis with subcutaneous purulent collection.  2. Reactive cervical lymphadenopathy. Electronically Signed   By: Ulyses Jarred M.D.   On: 10/22/2019 01:08    Procedures Procedures (including critical care time)  Medications Ordered in ED Medications  vancomycin (VANCOREADY) IVPB 1500 mg/300 mL (0 mg Intravenous Stopped 10/22/19 0121)  HYDROmorphone (DILAUDID) injection 1 mg (has no administration in time range)    Or  oxyCODONE-acetaminophen (PERCOCET/ROXICET) 5-325 MG per tablet 1 tablet (has no administration in time range)  lactated ringers infusion (has no administration in time range)  acetaminophen (TYLENOL) tablet 650 mg (has no administration in time range)  ondansetron (ZOFRAN) injection 4 mg (4 mg Intravenous Given 10/21/19 2235)  sodium chloride 0.9 % bolus 1,000 mL (0 mLs Intravenous Stopped 10/21/19 2342)  morphine 4 MG/ML injection 4 mg (4 mg Intravenous Given 10/21/19 2235)  HYDROmorphone (DILAUDID) injection 1 mg (1 mg Intravenous Given 10/21/19 2349)  iohexol (OMNIPAQUE) 300 MG/ML solution 75 mL (75 mLs Intravenous Contrast Given 10/22/19 0041)  ketorolac (TORADOL) 30 MG/ML injection 30 mg (30 mg Intravenous Given 10/22/19 0322)    ED Course  I  have reviewed the triage vital signs and the nursing notes.  Pertinent labs & imaging results that were available during my care of the patient were reviewed by me and considered in my medical decision making (see chart for details).  Clinical Course as of Oct 21 332  Fri Oct 22, 2019  0155 Spoke Dr. Cyd Silence, hospitalist, agrees to admit the patient.   [SJ]  0200 Spoke with Dr. Wilburn Cornelia, ENT.  We discussed CT results as well as my clinical concern for possible abscess. States he does not think there is anything that he needs drain at this time.  Recommends admission with IV antibiotics and if things change, recontact ENT.   [SJ]    Clinical Course User Index [SJ] Taggart Prasad, Helane Gunther, PA-C   MDM Rules/Calculators/A&P                          Patient presents with pain, erythema, and swelling to the face. Patient is nontoxic appearing, afebrile, not tachypneic, not hypotensive, maintains excellent SPO2 on room air  Initially tachycardic.  Mild elevation in lactic acid.  Improved with fluids. I have reviewed the patient's chart to obtain more information.   I reviewed and interpreted the patient's labs and radiological studies. Cellulitis noted on CT.  Purulent fluid collection also noted.   There is some concern for possible underlying abscess on physical exam. Patient admitted for further management.  Findings and plan of care discussed with Lajean Saver, MD. Dr. Ashok Cordia personally evaluated and examined this patient.  Vitals:   10/21/19 2200 10/21/19 2230 10/22/19 0100 10/22/19 0323  BP:  (!) 138/91 (!) 147/102 (!) 143/96  Pulse:  (!) 104 98 (!) 104  Resp:  (!) 23 20 18   Temp:   99 F (37.2 C) 98.8 F (37.1 C)  TempSrc:   Oral Oral  SpO2:  99% 95% 96%  Weight: 81.6 kg     Height: 5\' 8"  (1.727 m)        Final Clinical Impression(s) / ED Diagnoses Final diagnoses:  Cellulitis of face    Rx / DC Orders ED Discharge Orders    None       Layla Maw 10/22/19  0333    Lorayne Bender, PA-C 10/22/19 0334    Lajean Saver, MD 10/22/19 [phone]

## 2019-10-21 NOTE — ED Triage Notes (Signed)
Pt c/o abscess to chin, area swollen with drainage, HR 120. Emesis x 1

## 2019-10-22 ENCOUNTER — Encounter (HOSPITAL_COMMUNITY): Payer: Self-pay | Admitting: Internal Medicine

## 2019-10-22 ENCOUNTER — Emergency Department (HOSPITAL_COMMUNITY): Payer: Self-pay

## 2019-10-22 DIAGNOSIS — L03211 Cellulitis of face: Principal | ICD-10-CM

## 2019-10-22 DIAGNOSIS — L0201 Cutaneous abscess of face: Secondary | ICD-10-CM

## 2019-10-22 DIAGNOSIS — E872 Acidosis, unspecified: Secondary | ICD-10-CM | POA: Diagnosis present

## 2019-10-22 DIAGNOSIS — F17210 Nicotine dependence, cigarettes, uncomplicated: Secondary | ICD-10-CM | POA: Diagnosis present

## 2019-10-22 LAB — MRSA PCR SCREENING: MRSA by PCR: POSITIVE — AB

## 2019-10-22 LAB — BASIC METABOLIC PANEL
Anion gap: 10 (ref 5–15)
BUN: 5 mg/dL — ABNORMAL LOW (ref 6–20)
CO2: 27 mmol/L (ref 22–32)
Calcium: 8.8 mg/dL — ABNORMAL LOW (ref 8.9–10.3)
Chloride: 101 mmol/L (ref 98–111)
Creatinine, Ser: 0.92 mg/dL (ref 0.61–1.24)
GFR calc Af Amer: >60 mL/min (ref >60)
GFR calc non Af Amer: >60 mL/min (ref >60)
Glucose, Bld: 128 mg/dL — ABNORMAL HIGH (ref 70–99)
Potassium: 4 mmol/L (ref 3.5–5.1)
Sodium: 138 mmol/L (ref 135–145)

## 2019-10-22 LAB — SURGICAL PCR SCREEN
MRSA, PCR: POSITIVE — AB
Staphylococcus aureus: POSITIVE — AB

## 2019-10-22 LAB — CBC WITH DIFFERENTIAL/PLATELET
Abs Immature Granulocytes: 0.04 10*3/uL (ref 0.00–0.07)
Basophils Absolute: 0 10*3/uL (ref 0.0–0.1)
Basophils Relative: 1 %
Eosinophils Absolute: 0.2 10*3/uL (ref 0.0–0.5)
Eosinophils Relative: 2 %
HCT: 48.5 % (ref 39.0–52.0)
Hemoglobin: 17 g/dL (ref 13.0–17.0)
Immature Granulocytes: 1 %
Lymphocytes Relative: 14 %
Lymphs Abs: 1.2 10*3/uL (ref 0.7–4.0)
MCH: 31.7 pg (ref 26.0–34.0)
MCHC: 35.1 g/dL (ref 30.0–36.0)
MCV: 90.3 fL (ref 80.0–100.0)
Monocytes Absolute: 0.7 10*3/uL (ref 0.1–1.0)
Monocytes Relative: 8 %
Neutro Abs: 6.5 10*3/uL (ref 1.7–7.7)
Neutrophils Relative %: 74 %
Platelets: 177 10*3/uL (ref 150–400)
RBC: 5.37 MIL/uL (ref 4.22–5.81)
RDW: 12.5 % (ref 11.5–15.5)
WBC: 8.6 10*3/uL (ref 4.0–10.5)
nRBC: 0 % (ref 0.0–0.2)

## 2019-10-22 LAB — HIV ANTIBODY (ROUTINE TESTING W REFLEX): HIV Screen 4th Generation wRfx: NONREACTIVE

## 2019-10-22 LAB — SARS CORONAVIRUS 2 BY RT PCR (HOSPITAL ORDER, PERFORMED IN ~~LOC~~ HOSPITAL LAB): SARS Coronavirus 2: NEGATIVE

## 2019-10-22 LAB — MAGNESIUM: Magnesium: 2 mg/dL (ref 1.7–2.4)

## 2019-10-22 LAB — LACTIC ACID, PLASMA: Lactic Acid, Venous: 0.9 mmol/L (ref 0.5–1.9)

## 2019-10-22 MED ORDER — OXYCODONE-ACETAMINOPHEN 5-325 MG PO TABS
2.0000 | ORAL_TABLET | ORAL | Status: DC | PRN
  Administered 2019-10-23 – 2019-10-25 (×7): 2 via ORAL
  Filled 2019-10-22 (×7): qty 2

## 2019-10-22 MED ORDER — NICOTINE 21 MG/24HR TD PT24
21.0000 mg | MEDICATED_PATCH | Freq: Every day | TRANSDERMAL | Status: DC
  Filled 2019-10-22 (×3): qty 1

## 2019-10-22 MED ORDER — HYDROMORPHONE HCL 1 MG/ML IJ SOLN
1.0000 mg | INTRAMUSCULAR | Status: DC | PRN
  Administered 2019-10-23 – 2019-10-26 (×4): 1 mg via INTRAVENOUS
  Filled 2019-10-22 (×4): qty 1

## 2019-10-22 MED ORDER — ACETAMINOPHEN 325 MG PO TABS: 650.0000 mg | ORAL_TABLET | ORAL | Status: DC | PRN

## 2019-10-22 MED ORDER — ENOXAPARIN SODIUM 40 MG/0.4ML ~~LOC~~ SOLN
40.0000 mg | SUBCUTANEOUS | Status: DC
  Administered 2019-10-22 – 2019-10-26 (×3): 40 mg via SUBCUTANEOUS
  Filled 2019-10-22 (×4): qty 0.4

## 2019-10-22 MED ORDER — MUPIROCIN 2 % EX OINT
1.0000 "application " | TOPICAL_OINTMENT | Freq: Two times a day (BID) | CUTANEOUS | Status: DC
  Administered 2019-10-22 – 2019-10-26 (×8): 1 via NASAL
  Filled 2019-10-22 (×2): qty 22

## 2019-10-22 MED ORDER — ALBUTEROL SULFATE (2.5 MG/3ML) 0.083% IN NEBU: 2.5000 mg | INHALATION_SOLUTION | RESPIRATORY_TRACT | Status: DC | PRN

## 2019-10-22 MED ORDER — KETOROLAC TROMETHAMINE 30 MG/ML IJ SOLN
30.0000 mg | Freq: Four times a day (QID) | INTRAMUSCULAR | Status: DC | PRN
  Administered 2019-10-22: 30 mg via INTRAVENOUS
  Filled 2019-10-22: qty 1

## 2019-10-22 MED ORDER — IOHEXOL 300 MG/ML  SOLN
75.0000 mL | Freq: Once | INTRAMUSCULAR | Status: AC | PRN
  Administered 2019-10-22: 75 mL via INTRAVENOUS

## 2019-10-22 MED ORDER — CHLORHEXIDINE GLUCONATE CLOTH 2 % EX PADS
6.0000 | MEDICATED_PAD | Freq: Every day | CUTANEOUS | Status: AC
  Administered 2019-10-22 – 2019-10-26 (×5): 6 via TOPICAL

## 2019-10-22 MED ORDER — KETOROLAC TROMETHAMINE 30 MG/ML IJ SOLN
30.0000 mg | Freq: Once | INTRAMUSCULAR | Status: AC
  Administered 2019-10-22: 30 mg via INTRAVENOUS
  Filled 2019-10-22: qty 1

## 2019-10-22 MED ORDER — OXYCODONE-ACETAMINOPHEN 5-325 MG PO TABS
1.0000 | ORAL_TABLET | ORAL | Status: DC | PRN
  Administered 2019-10-22 (×3): 1 via ORAL
  Filled 2019-10-22 (×3): qty 1

## 2019-10-22 MED ORDER — HYDROMORPHONE HCL 1 MG/ML IJ SOLN
1.0000 mg | INTRAMUSCULAR | Status: DC | PRN
  Administered 2019-10-22 (×2): 1 mg via INTRAVENOUS
  Filled 2019-10-22 (×2): qty 1

## 2019-10-22 MED ORDER — LACTATED RINGERS IV SOLN: INTRAVENOUS | Status: AC

## 2019-10-22 MED ORDER — ALBUTEROL SULFATE (2.5 MG/3ML) 0.083% IN NEBU: 2.5000 mg | INHALATION_SOLUTION | Freq: Once | RESPIRATORY_TRACT | Status: DC

## 2019-10-22 NOTE — H&P (Signed)
History and Physical    Brett Wu HYW:[id number] DOB: 1995-06-12 DOA: 10/21/2019  PCP: Patient, No Pcp Per  Patient coming from: Home   Chief Complaint:   Facial pain  HPI:    24 year old male with past medical history of nicotine dependence who presents to [facility] emergency department with complaints of facial pain.  Patient explains that 4 days ago he began to try and pick ingrown hairs from his chin.  In the days that followed, patient began to develop multiple pimples over his chin that he frequently attempted to pop unsuccessfully.  Over the next several days patient began to develop pain of the chin, burning to stinging in quality, progressively worsening until it became severe in intensity.  Pain is worse with attempting to open his mouth or ingest food.  Pain radiates down the neck.  As the patient symptoms continue to worsen over the next several days patient also began to develop generalized weakness and poor oral intake, primarily due to having difficulty opening his mouth and chewing food.  Patient eventually presented to [facility] emergency department for evaluation.  Upon evaluation in the emergency department patient was clinically found to have significant cellulitis of the chin neck and face with areas of spontaneous purulent drainage.  CT imaging of the soft tissues of the neck was performed revealing a left facial cellulitis with subcutaneous purulent collection and reactive cervical lymphadenopathy.  These images were reviewed with Dr. Annalee Genta with ENT who felt that surgical intervention was not necessary and recommended hospitalization with intravenous antibiotics and to formally consult ENT later in the hospitalization if patient clinically worsened.  Intravenous vancomycin was initiated.  The hospitalist group was then called to assess the patient for admission the hospital.  Review of Systems: A 10-system review of systems has been performed  and all systems are negative with the exception of what is listed in the HPI.    History reviewed. No pertinent past medical history.  History reviewed. No pertinent surgical history.   reports that he has been smoking cigarettes. He has been smoking about 0.50 packs per day. He has never used smokeless tobacco. He reports current alcohol use. He reports current drug use. Drug: Marijuana.  No Known Allergies  Family History  Problem Relation Age of Onset  . Other Neg Hx      Prior to Admission medications   Not on File    Physical Exam: Vitals:   10/21/19 1927 10/21/19 2200 10/21/19 2230 10/22/19 0100  BP: 139/82  (!) 138/91 (!) 147/102  Pulse: (!) 120  (!) 104 98  Resp: (!) 22  (!) 23 20  Temp: 98.9 F (37.2 C)   99 F (37.2 C)  TempSrc: Oral   Oral  SpO2: 100%  99% 95%  Weight:  81.6 kg    Height:  5\' 8"  (1.727 m)      Constitutional: Acute alert and oriented x3, patient is in distress due to pain. Skin: Significant redness and induration of the chin, extending to the neck and left face.  Multiple areas of spontaneous purulent drainage noted of the chin.  Skin turgor otherwise is poor.   Eyes: Pupils are equally reactive to light.  No evidence of scleral icterus or conjunctival pallor.  ENMT: Dry mucous membranes noted.  Posterior pharynx clear of any exudate or lesions.   Neck: normal, supple, no masses, no thyromegaly.  No evidence of jugular venous distension.   Respiratory: Notable expiratory wheezing in all  fields, no evidence of rales, normal respiratory effort. No accessory muscle use.  Cardiovascular: Regular rate and rhythm, no murmurs / rubs / gallops. No extremity edema. 2+ pedal pulses. No carotid bruits.  Chest:   Nontender without crepitus or deformity.   Back:   Nontender without crepitus or deformity. Abdomen: Abdomen is soft and nontender.  No evidence of intra-abdominal masses.  Positive bowel sounds noted in all quadrants.   Musculoskeletal: No joint  deformity upper and lower extremities. Good ROM, no contractures. Normal muscle tone.  Neurologic: CN 2-12 grossly intact. Sensation intact, strength noted to be 5 out of 5 in all 4 extremities.  Patient is following all commands.  Patient is responsive to verbal stimuli.   Psychiatric: Patient presents as a normal mood with appropriate affect.  Patient seems to possess insight as to theircurrent situation.     Labs on Admission: I have personally reviewed following labs and imaging studies -   CBC: Recent Labs  Lab 10/21/19 1954  WBC 9.8  NEUTROABS 8.3*  HGB 17.1*  HCT 49.1  MCV 90.9  PLT 627   Basic Metabolic Panel: Recent Labs  Lab 10/21/19 1954  NA 133*  K 3.6  CL 98  CO2 24  GLUCOSE 248*  BUN <5*  CREATININE 0.92  CALCIUM 8.9   GFR: Estimated Creatinine Clearance: 121.8 mL/min (by C-G formula based on SCr of 0.92 mg/dL). Liver Function Tests: Recent Labs  Lab 10/21/19 1954  AST 36  ALT 106*  ALKPHOS 116  BILITOT 1.6*  PROT 6.9  ALBUMIN 3.8   No results for input(s): LIPASE, AMYLASE in the last 168 hours. No results for input(s): AMMONIA in the last 168 hours. Coagulation Profile: Recent Labs  Lab 10/21/19 1954  INR 1.0   Cardiac Enzymes: No results for input(s): CKTOTAL, CKMB, CKMBINDEX, TROPONINI in the last 168 hours. BNP (last 3 results) No results for input(s): PROBNP in the last 8760 hours. HbA1C: No results for input(s): HGBA1C in the last 72 hours. CBG: No results for input(s): GLUCAP in the last 168 hours. Lipid Profile: No results for input(s): CHOL, HDL, LDLCALC, TRIG, CHOLHDL, LDLDIRECT in the last 72 hours. Thyroid Function Tests: No results for input(s): TSH, T4TOTAL, FREET4, T3FREE, THYROIDAB in the last 72 hours. Anemia Panel: No results for input(s): VITAMINB12, FOLATE, FERRITIN, TIBC, IRON, RETICCTPCT in the last 72 hours. Urine analysis:    Component Value Date/Time   COLORURINE YELLOW 10/21/2019 2320   APPEARANCEUR CLEAR  10/21/2019 2320   LABSPEC 1.007 10/21/2019 2320   PHURINE 6.0 10/21/2019 2320   GLUCOSEU >=500 (A) 10/21/2019 2320   HGBUR NEGATIVE 10/21/2019 2320   BILIRUBINUR NEGATIVE 10/21/2019 2320   KETONESUR NEGATIVE 10/21/2019 2320   PROTEINUR NEGATIVE 10/21/2019 2320   NITRITE NEGATIVE 10/21/2019 2320   LEUKOCYTESUR NEGATIVE 10/21/2019 2320    Radiological Exams on Admission - Personally Reviewed: CT Soft Tissue Neck W Contrast  Result Date: 10/22/2019 CLINICAL DATA:  Facial cellulitis EXAM: CT NECK WITH CONTRAST TECHNIQUE: Multidetector CT imaging of the neck was performed using the standard protocol following the bolus administration of intravenous contrast. CONTRAST:  63mL OMNIPAQUE IOHEXOL 300 MG/ML  SOLN COMPARISON:  None. FINDINGS: PHARYNX AND LARYNX: The nasopharynx, oropharynx and larynx are normal. Visible portions of the oral cavity, tongue base and floor of mouth are normal. Normal epiglottis, vallecula and pyriform sinuses. The larynx is normal. No retropharyngeal abscess, effusion or lymphadenopathy. SALIVARY GLANDS: Mildly enlarged left submandibular gland, likely reactive. Other salivary glands are normal. THYROID:  Normal. LYMPH NODES: Bilateral level 1A and 1B reactive cervical lymph nodes. VASCULAR: Major cervical vessels are patent. LIMITED INTRACRANIAL: Normal. VISUALIZED ORBITS: Normal. MASTOIDS AND VISUALIZED PARANASAL SINUSES: No fluid levels or advanced mucosal thickening. No mastoid effusion. SKELETON: No bony spinal canal stenosis. No lytic or blastic lesions. UPPER CHEST: Clear. OTHER: There is heterogeneous induration of the subcutaneous tissues anterior to the left mandibular body. There is a small amount of internal intermediate density material, likely purulence. There is thickening of the platysma muscle. IMPRESSION: 1. Left facial cellulitis with subcutaneous purulent collection. 2. Reactive cervical lymphadenopathy. Electronically Signed   By: Deatra Robinson M.D.   On:  10/22/2019 01:08    Assessment/Plan Principal Problem:   Cellulitis of face   Patient exhibiting substantial cellulitis of face with concerns for developing abscess, particularly of the chin.  Evidence of subcutaneous purulent fluid collection on CT imaging of the neck.  These images have been reviewed with Dr. Annalee Genta by the emergency department staff who is stated that he feels that surgical intervention is not warranted at this time and recommended hospitalization with intravenous antibiotic therapy and to contact ENT if patient clinically worsens.  Patient has been initiated on intravenous vancomycin by the emergency department which will be continued at this time.  Initiating intravenous volume resuscitation especially considering concurrent lactic acidosis.  I have performed a swab of the spontaneous purulent drainage at the bedside.  Antibiotics will be deescalated based on these results.  Blood cultures have additionally been obtained.    MRSA PCR screen has been ordered.  As needed opiate-based analgesics for substantial associated pain.  Patient denies any history of intravenous drug abuse.  Active Problems:   Abscess of chin   Please see assessment and plan above    Lactic acidosis   Patient presented with mild lactic acidosis  This is likely secondary to volume depletion and underlying infection  Hydrating patient with intravenous isotonic fluids and treating underlying infection with intravenous antibiotics  Performing serial lactic acid levels to ensure downtrending and resolution    Nicotine dependence, cigarettes, uncomplicated  Counseling patient on smoking cessation  Patient declines any nicotine replacement therapy while hospitalized.   Code Status:  Full code Family Communication: Deferred  Status is: Inpatient  Remains inpatient appropriate because:IV treatments appropriate due to intensity of illness or inability to take PO and Inpatient  level of care appropriate due to severity of illness   Dispo: The patient is from: Home              Anticipated d/c is to: Home              Anticipated d/c date is: > 3 days              Patient currently is not medically stable to d/c.        Marinda Elk MD [facility] Pager [phone]  If 7PM-7AM, please contact night-coverage [email or web address] Use universal [facility] password for that web site. If you do not have the password, please call the hospital operator.  10/22/2019, 3:15 AM

## 2019-10-22 NOTE — Progress Notes (Signed)
Phlebotomist reports that she found a tick on the patient's left forearm. She removed and placed in sharps container. MD notified. No rash noted at removal site.

## 2019-10-22 NOTE — Progress Notes (Signed)
I have seen and assessed patient and agree with Dr. Elinor Parkinson assessment and plan.  Patient is a 24 year old gentleman history of tobacco dependence presented to [facility] with complaints of right facial pain.  Patient stated to admitting physician that he tried to pick an ingrown hairs from his chin and developed multiple pimples over the chin that he attempted to pop unsuccessfully.  Over the next several days patient noted to have increased pain around his chin, burning, stinging quality, worsening in intensity with difficulty opening his mouth to ingest food.  Pain noted to radiated in his neck.  Seen in the ED CT soft tissue neck done revealed left facial cellulitis with subcutaneous purulent collection and reactive cervical lymphadenopathy.  Patient currently on IV vancomycin, ongoing left neck pain and chin pain.  Chin with induration, tenderness to palpation.  Concern for abscess formation.  Will consult with ENT to assess patient to see if patient needs a I and D and further evaluation.  Supportive care.  Follow.    No charge.

## 2019-10-22 NOTE — Consult Note (Signed)
ENT CONSULT:  Reason for Consult: Facial cellulitis and abscess Referring Physician: Hospital service  Brett Wu is an 24 y.o. male.  HPI: Patient admitted to [facility] with a 4-day history of progressive swelling and pain in the chin region.  The patient developed a small area of folliculitis which he manipulated.  Over the last 36 hours this area has continued to increase in swelling and tenderness.  Moderate erythema.  Patient presented to the emergency department this a.m. with acute worsening symptoms.  History reviewed. No pertinent past medical history.  History reviewed. No pertinent surgical history.  Family History  Problem Relation Age of Onset   Other Neg Hx     Social History:  reports that he has been smoking cigarettes. He has been smoking about 0.50 packs per day. He has never used smokeless tobacco. He reports current alcohol use. He reports current drug use. Drug: Marijuana.  Allergies: No Known Allergies  Medications: I have reviewed the patient's current medications.  Results for orders placed or performed during the hospital encounter of 10/21/19 (from the past 48 hour(s))  Comprehensive metabolic panel     Status: Abnormal   Collection Time: 10/21/19  7:54 PM  Result Value Ref Range   Sodium 133 (L) 135 - 145 mmol/L   Potassium 3.6 3.5 - 5.1 mmol/L   Chloride 98 98 - 111 mmol/L   CO2 24 22 - 32 mmol/L   Glucose, Bld 248 (H) 70 - 99 mg/dL    Comment: Glucose reference range applies only to samples taken after fasting for at least 8 hours.   BUN <5 (L) 6 - 20 mg/dL   Creatinine, Ser 6.83 0.61 - 1.24 mg/dL   Calcium 8.9 8.9 - 41.9 mg/dL   Total Protein 6.9 6.5 - 8.1 g/dL   Albumin 3.8 3.5 - 5.0 g/dL   AST 36 15 - 41 U/L   ALT 106 (H) 0 - 44 U/L   Alkaline Phosphatase 116 38 - 126 U/L   Total Bilirubin 1.6 (H) 0.3 - 1.2 mg/dL   GFR calc non Af Amer >60 >60 mL/min   GFR calc Af Amer >60 >60 mL/min   Anion gap 11 5 - 15    Comment:  Performed at [facility], [address], [place], [place] [address]  Lactic acid, plasma     Status: Abnormal   Collection Time: 10/21/19  7:54 PM  Result Value Ref Range   Lactic Acid, Venous 2.3 (HH) 0.5 - 1.9 mmol/L    Comment: CRITICAL RESULT CALLED TO, READ BACK BY AND VERIFIED WITH: RN A DENNIS AT 2050 10/21/19 BY L BENFIELD Performed at [facility], [address], [place], [place] [address]   CBC with Differential     Status: Abnormal   Collection Time: 10/21/19  7:54 PM  Result Value Ref Range   WBC 9.8 4.0 - 10.5 K/uL   RBC 5.40 4.22 - 5.81 MIL/uL   Hemoglobin 17.1 (H) 13.0 - 17.0 g/dL   HCT 11.9 39 - 52 %   MCV 90.9 80.0 - 100.0 fL   MCH 31.7 26.0 - 34.0 pg   MCHC 34.8 30.0 - 36.0 g/dL   RDW 41.7 40.8 - 14.4 %   Platelets 179 150 - 400 K/uL   nRBC 0.0 0.0 - 0.2 %   Neutrophils Relative % 85 %   Neutro Abs 8.3 (H) 1.7 - 7.7 K/uL   Lymphocytes Relative 9 %   Lymphs  Abs 0.9 0.7 - 4.0 K/uL   Monocytes Relative 5 %   Monocytes Absolute 0.5 0 - 1 K/uL   Eosinophils Relative 1 %   Eosinophils Absolute 0.1 0 - 0 K/uL   Basophils Relative 0 %   Basophils Absolute 0.0 0 - 0 K/uL   Immature Granulocytes 0 %   Abs Immature Granulocytes 0.03 0.00 - 0.07 K/uL    Comment: Performed at [facility], [address] [place], [place] [address]  Protime-INR     Status: None   Collection Time: 10/21/19  7:54 PM  Result Value Ref Range   Prothrombin Time 13.2 11.4 - 15.2 seconds   INR 1.0 0.8 - 1.2    Comment: (NOTE) INR goal varies based on device and disease states. Performed at [facility], [address] [place], [place] [address]   Culture, blood (Routine x 2)     Status: None (Preliminary result)   Collection Time: 10/21/19  7:54 PM   Specimen: BLOOD  Result Value Ref Range   Specimen Description BLOOD LEFT ARM    Special Requests      BOTTLES DRAWN AEROBIC AND ANAEROBIC Blood Culture adequate volume   Culture      NO GROWTH < 12  HOURS Performed at [facility], [address] [place], [place] [address]    Report Status PENDING   Culture, blood (Routine x 2)     Status: None (Preliminary result)   Collection Time: 10/21/19  7:54 PM   Specimen: BLOOD LEFT HAND  Result Value Ref Range   Specimen Description BLOOD LEFT HAND    Special Requests      BOTTLES DRAWN AEROBIC ONLY Blood Culture adequate volume   Culture      NO GROWTH < 12 HOURS Performed at [facility], [address] [place], [place] [address]    Report Status PENDING   Urinalysis, Routine w reflex microscopic     Status: Abnormal   Collection Time: 10/21/19 11:20 PM  Result Value Ref Range   Color, Urine YELLOW YELLOW   APPearance CLEAR CLEAR   Specific Gravity, Urine 1.007 1.005 - 1.030   pH 6.0 5.0 - 8.0   Glucose, UA >=500 (A) NEGATIVE mg/dL   Hgb urine dipstick NEGATIVE NEGATIVE   Bilirubin Urine NEGATIVE NEGATIVE   Ketones, ur NEGATIVE NEGATIVE mg/dL   Protein, ur NEGATIVE NEGATIVE mg/dL   Nitrite NEGATIVE NEGATIVE   Leukocytes,Ua NEGATIVE NEGATIVE   WBC, UA 0-5 0 - 5 WBC/hpf   Bacteria, UA NONE SEEN NONE SEEN   Squamous Epithelial / LPF 0-5 0 - 5    Comment: Performed at [facility], [address] [place], [place] [address]  Lactic acid, plasma     Status: None   Collection Time: 10/22/19 12:03 AM  Result Value Ref Range   Lactic Acid, Venous 0.9 0.5 - 1.9 mmol/L    Comment: Performed at [facility], [address] [place], [place] [address]  SARS Coronavirus 2 by RT PCR (hospital order, performed in [facility] hospital lab) Nasopharyngeal Nasopharyngeal Swab     Status: None   Collection Time: 10/22/19 12:57 AM   Specimen: Nasopharyngeal Swab  Result Value Ref Range   SARS Coronavirus 2 NEGATIVE NEGATIVE    Comment: (NOTE) SARS-CoV-2 target nucleic acids are NOT DETECTED.  The SARS-CoV-2 RNA is generally detectable in upper and lower respiratory specimens during the acute phase of infection.  The lowest concentration of SARS-CoV-2 viral copies this assay can detect is 250 copies / mL. A negative result does not preclude SARS-CoV-2 infection and should not be used as the sole basis for treatment or other patient management decisions.  A negative result may occur with improper specimen collection / handling, submission of specimen other than nasopharyngeal swab, presence of viral mutation(s) within the areas targeted by this assay, and inadequate number of viral copies (<250 copies / mL). A negative result must be combined with clinical observations, patient history, and epidemiological information.  Fact Sheet for Patients:   BoilerBrush.com.cy  Fact Sheet for Healthcare Providers: https://pope.com/  This test is not yet approved or  cleared by the [place] FDA and has been authorized for detection and/or diagnosis of SARS-CoV-2 by FDA under an Emergency Use Authorization (EUA).  This EUA will remain in effect (meaning this test can be used) for the duration of the COVID-19 declaration under Section 564(b)(1) of the Act, 21 U.S.C. section 360bbb-3(b)(1), unless the authorization is terminated or revoked sooner.  Performed at [facility], [address] [place], [place] [address]   Aerobic Culture (superficial specimen)     Status: None (Preliminary result)   Collection Time: 10/22/19  3:03 AM   Specimen: Wound  Result Value Ref Range   Specimen Description WOUND FACE    Special Requests CHIN    Gram Stain      RARE WBC PRESENT, PREDOMINANTLY PMN NO ORGANISMS SEEN Performed at [facility], [address] [place], [place] [address]    Culture PENDING    Report Status PENDING   MRSA PCR Screening     Status: Abnormal   Collection Time: 10/22/19  5:15 AM   Specimen: Nasal Mucosa; Nasopharyngeal  Result Value Ref Range   MRSA by PCR POSITIVE (A) NEGATIVE    Comment:        The GeneXpert MRSA  Assay (FDA approved for NASAL specimens only), is one component of a comprehensive MRSA colonization surveillance program. It is not intended to diagnose MRSA infection nor to guide or monitor treatment for MRSA infections. RESULT CALLED TO, READ BACK BY AND VERIFIED WITH: ABIERA,L RN 10/22/2019 AT 1245 SKEEN,P Performed at [facility], [address], [place], [place] [address]   HIV Antibody (routine testing w rflx)     Status: None   Collection Time: 10/22/19  9:56 AM  Result Value Ref Range   HIV Screen 4th Generation wRfx Non Reactive Non Reactive    Comment: Performed at [facility], [address], [place], [place] [address]  Magnesium     Status: None   Collection Time: 10/22/19  9:56 AM  Result Value Ref Range   Magnesium 2.0 1.7 - 2.4 mg/dL    Comment: Performed at [facility], [address], [place], [place] [address]  Basic metabolic panel     Status: Abnormal   Collection Time: 10/22/19  9:56 AM  Result Value Ref Range   Sodium 138 135 - 145 mmol/L   Potassium 4.0 3.5 - 5.1 mmol/L   Chloride 101 98 - 111 mmol/L   CO2 27 22 - 32 mmol/L   Glucose, Bld 128 (H) 70 - 99 mg/dL    Comment: Glucose reference range applies only to samples taken after fasting for at least 8 hours.   BUN 5 (L) 6 - 20 mg/dL   Creatinine, Ser 6.73 0.61 - 1.24 mg/dL   Calcium  8.8 (L) 8.9 - 10.3 mg/dL   GFR calc non Af Amer >60 >60 mL/min   GFR calc Af Amer >60 >60 mL/min   Anion gap 10 5 - 15    Comment: Performed at [facility], [address] [place], [place] [address]  CBC with Differential/Platelet     Status: None   Collection Time: 10/22/19  9:56 AM  Result Value Ref Range   WBC 8.6 4.0 - 10.5 K/uL   RBC 5.37 4.22 - 5.81 MIL/uL   Hemoglobin 17.0 13.0 - 17.0 g/dL   HCT 02.7 39 - 52 %   MCV 90.3 80.0 - 100.0 fL   MCH 31.7 26.0 - 34.0 pg   MCHC 35.1 30.0 - 36.0 g/dL   RDW 74.1 28.7 - 86.7 %   Platelets 177 150 - 400 K/uL   nRBC 0.0 0.0 - 0.2 %    Neutrophils Relative % 74 %   Neutro Abs 6.5 1.7 - 7.7 K/uL   Lymphocytes Relative 14 %   Lymphs Abs 1.2 0.7 - 4.0 K/uL   Monocytes Relative 8 %   Monocytes Absolute 0.7 0 - 1 K/uL   Eosinophils Relative 2 %   Eosinophils Absolute 0.2 0 - 0 K/uL   Basophils Relative 1 %   Basophils Absolute 0.0 0 - 0 K/uL   Immature Granulocytes 1 %   Abs Immature Granulocytes 0.04 0.00 - 0.07 K/uL    Comment: Performed at [facility], [address] [place], [place] [address]    CT Soft Tissue Neck W Contrast  Result Date: 10/22/2019 CLINICAL DATA:  Facial cellulitis EXAM: CT NECK WITH CONTRAST TECHNIQUE: Multidetector CT imaging of the neck was performed using the standard protocol following the bolus administration of intravenous contrast. CONTRAST:  70mL OMNIPAQUE IOHEXOL 300 MG/ML  SOLN COMPARISON:  None. FINDINGS: PHARYNX AND LARYNX: The nasopharynx, oropharynx and larynx are normal. Visible portions of the oral cavity, tongue base and floor of mouth are normal. Normal epiglottis, vallecula and pyriform sinuses. The larynx is normal. No retropharyngeal abscess, effusion or lymphadenopathy. SALIVARY GLANDS: Mildly enlarged left submandibular gland, likely reactive. Other salivary glands are normal. THYROID: Normal. LYMPH NODES: Bilateral level 1A and 1B reactive cervical lymph nodes. VASCULAR: Major cervical vessels are patent. LIMITED INTRACRANIAL: Normal. VISUALIZED ORBITS: Normal. MASTOIDS AND VISUALIZED PARANASAL SINUSES: No fluid levels or advanced mucosal thickening. No mastoid effusion. SKELETON: No bony spinal canal stenosis. No lytic or blastic lesions. UPPER CHEST: Clear. OTHER: There is heterogeneous induration of the subcutaneous tissues anterior to the left mandibular body. There is a small amount of internal intermediate density material, likely purulence. There is thickening of the platysma muscle. IMPRESSION: 1. Left facial cellulitis with subcutaneous purulent collection. 2. Reactive  cervical lymphadenopathy. Electronically Signed   By: Deatra Robinson M.D.   On: 10/22/2019 01:08    ROS:ROS  Blood pressure 129/89, pulse (!) 109, temperature 98.9 F (37.2 C), temperature source Oral, resp. rate 17, height 5\' 8"  (1.727 m), weight 81.6 kg, SpO2 97 %.  PHYSICAL EXAM: General appearance - alert, well appearing, and in no distress Neck - supple, no significant adenopathy Face-swelling and erythema overlying the chin.  Small area of purulent drainage.  Studies Reviewed: Maxillofacial CT scan reviewed shows enhancing loculated area consistent with soft tissue infection and abscess overlying the chin.  Dentition is normal and no evidence of sinusitis or other infection.  Assessment/Plan: The patient presents to the [facility] emergency department with acute symptoms  of swelling and pain which been progressive over the last 4 days.  He had a small area of folliculitis which he manipulated and this has worsened.  He was admitted for intravenous antibiotic therapy, CT scan is reviewed and shows loculated area with soft tissue swelling consistent with acute infection.  Given the patient's history and findings I recommended incision and drainage of the infection under general anesthesia, unfortunately the patient had eaten this afternoon and surgery will be scheduled for tomorrow morning.  Continue current pain medication and intravenous antibiotics as prescribed.  Jerrell Belfast 10/22/2019, 1:12 PM

## 2019-10-22 NOTE — Progress Notes (Signed)
Patient refused heat pack. Ilean Skill LPN

## 2019-10-23 ENCOUNTER — Inpatient Hospital Stay (HOSPITAL_COMMUNITY): Payer: Self-pay | Admitting: Certified Registered"

## 2019-10-23 ENCOUNTER — Encounter (HOSPITAL_COMMUNITY)
Admission: EM | Disposition: A | Discharge status: Home / Self Care | Payer: Self-pay | Source: Home / Self Care | Attending: Internal Medicine

## 2019-10-23 ENCOUNTER — Encounter (HOSPITAL_COMMUNITY): Payer: Self-pay | Admitting: Internal Medicine

## 2019-10-23 DIAGNOSIS — E860 Dehydration: Secondary | ICD-10-CM

## 2019-10-23 HISTORY — PX: CYST EXCISION: SHX5701

## 2019-10-23 LAB — CBC WITH DIFFERENTIAL/PLATELET
Abs Immature Granulocytes: 0.02 10*3/uL (ref 0.00–0.07)
Basophils Absolute: 0 10*3/uL (ref 0.0–0.1)
Basophils Relative: 0 %
Eosinophils Absolute: 0 10*3/uL (ref 0.0–0.5)
Eosinophils Relative: 0 %
HCT: 47.7 % (ref 39.0–52.0)
Hemoglobin: 16.9 g/dL (ref 13.0–17.0)
Immature Granulocytes: 0 %
Lymphocytes Relative: 6 %
Lymphs Abs: 0.4 10*3/uL — ABNORMAL LOW (ref 0.7–4.0)
MCH: 31.9 pg (ref 26.0–34.0)
MCHC: 35.4 g/dL (ref 30.0–36.0)
MCV: 90.2 fL (ref 80.0–100.0)
Monocytes Absolute: 0.1 10*3/uL (ref 0.1–1.0)
Monocytes Relative: 1 %
Neutro Abs: 6.3 10*3/uL (ref 1.7–7.7)
Neutrophils Relative %: 93 %
Platelets: 229 10*3/uL (ref 150–400)
RBC: 5.29 MIL/uL (ref 4.22–5.81)
RDW: 12 % (ref 11.5–15.5)
WBC: 6.8 10*3/uL (ref 4.0–10.5)
nRBC: 0 % (ref 0.0–0.2)

## 2019-10-23 LAB — BASIC METABOLIC PANEL
Anion gap: 11 (ref 5–15)
BUN: 8 mg/dL (ref 6–20)
CO2: 27 mmol/L (ref 22–32)
Calcium: 8.9 mg/dL (ref 8.9–10.3)
Chloride: 100 mmol/L (ref 98–111)
Creatinine, Ser: 0.89 mg/dL (ref 0.61–1.24)
GFR calc Af Amer: >60 mL/min (ref >60)
GFR calc non Af Amer: >60 mL/min (ref >60)
Glucose, Bld: 211 mg/dL — ABNORMAL HIGH (ref 70–99)
Potassium: 4.4 mmol/L (ref 3.5–5.1)
Sodium: 138 mmol/L (ref 135–145)

## 2019-10-23 LAB — HEPATIC FUNCTION PANEL
ALT: 63 U/L — ABNORMAL HIGH (ref 0–44)
AST: 24 U/L (ref 15–41)
Albumin: 3.2 g/dL — ABNORMAL LOW (ref 3.5–5.0)
Alkaline Phosphatase: 115 U/L (ref 38–126)
Bilirubin, Direct: 0.2 mg/dL (ref 0.0–0.2)
Indirect Bilirubin: 0.7 mg/dL (ref 0.3–0.9)
Total Bilirubin: 0.9 mg/dL (ref 0.3–1.2)
Total Protein: 6.7 g/dL (ref 6.5–8.1)

## 2019-10-23 SURGERY — CYST REMOVAL
Anesthesia: General

## 2019-10-23 MED ORDER — DEXAMETHASONE SODIUM PHOSPHATE 10 MG/ML IJ SOLN
INTRAMUSCULAR | Status: AC
  Filled 2019-10-23: qty 1

## 2019-10-23 MED ORDER — PROPOFOL 10 MG/ML IV BOLUS
INTRAVENOUS | Status: AC
  Filled 2019-10-23: qty 20

## 2019-10-23 MED ORDER — FENTANYL CITRATE (PF) 100 MCG/2ML IJ SOLN
INTRAMUSCULAR | Status: AC
  Filled 2019-10-23: qty 2

## 2019-10-23 MED ORDER — MIDAZOLAM HCL 2 MG/2ML IJ SOLN
INTRAMUSCULAR | Status: DC | PRN
  Administered 2019-10-23: 2 mg via INTRAVENOUS

## 2019-10-23 MED ORDER — FENTANYL CITRATE (PF) 100 MCG/2ML IJ SOLN
INTRAMUSCULAR | Status: AC
  Administered 2019-10-23: 100 ug via INTRAVENOUS
  Filled 2019-10-23: qty 2

## 2019-10-23 MED ORDER — LACTATED RINGERS IV SOLN: INTRAVENOUS | Status: DC

## 2019-10-23 MED ORDER — FENTANYL CITRATE (PF) 100 MCG/2ML IJ SOLN
25.0000 ug | INTRAMUSCULAR | Status: DC | PRN
  Administered 2019-10-23 (×2): 25 ug via INTRAVENOUS

## 2019-10-23 MED ORDER — MIDAZOLAM HCL 2 MG/2ML IJ SOLN
INTRAMUSCULAR | Status: AC
  Filled 2019-10-23: qty 2

## 2019-10-23 MED ORDER — SENNOSIDES-DOCUSATE SODIUM 8.6-50 MG PO TABS
1.0000 | ORAL_TABLET | Freq: Two times a day (BID) | ORAL | Status: DC
  Administered 2019-10-23 – 2019-10-25 (×4): 1 via ORAL
  Filled 2019-10-23 (×6): qty 1

## 2019-10-23 MED ORDER — FENTANYL CITRATE (PF) 100 MCG/2ML IJ SOLN
INTRAMUSCULAR | Status: DC | PRN
  Administered 2019-10-23: 50 ug via INTRAVENOUS
  Administered 2019-10-23: 75 ug via INTRAVENOUS

## 2019-10-23 MED ORDER — VANCOMYCIN HCL 1500 MG/300ML IV SOLN
1500.0000 mg | Freq: Two times a day (BID) | INTRAVENOUS | Status: DC
  Administered 2019-10-23 – 2019-10-25 (×5): 1500 mg via INTRAVENOUS
  Filled 2019-10-23 (×8): qty 300

## 2019-10-23 MED ORDER — FENTANYL CITRATE (PF) 100 MCG/2ML IJ SOLN: 100.0000 ug | Freq: Once | INTRAMUSCULAR | Status: AC

## 2019-10-23 MED ORDER — LIDOCAINE 2% (20 MG/ML) 5 ML SYRINGE
INTRAMUSCULAR | Status: AC
  Filled 2019-10-23: qty 5

## 2019-10-23 MED ORDER — ONDANSETRON HCL 4 MG/2ML IJ SOLN
INTRAMUSCULAR | Status: AC
  Filled 2019-10-23: qty 2

## 2019-10-23 MED ORDER — DEXAMETHASONE SODIUM PHOSPHATE 10 MG/ML IJ SOLN
INTRAMUSCULAR | Status: DC | PRN
  Administered 2019-10-23: 10 mg via INTRAVENOUS

## 2019-10-23 MED ORDER — PHENYLEPHRINE 40 MCG/ML (10ML) SYRINGE FOR IV PUSH (FOR BLOOD PRESSURE SUPPORT)
PREFILLED_SYRINGE | INTRAVENOUS | Status: DC | PRN
  Administered 2019-10-23: 200 ug via INTRAVENOUS

## 2019-10-23 MED ORDER — ONDANSETRON HCL 4 MG/2ML IJ SOLN: 4.0000 mg | Freq: Once | INTRAMUSCULAR | Status: DC | PRN

## 2019-10-23 MED ORDER — LIDOCAINE-EPINEPHRINE 1 %-1:100000 IJ SOLN
INTRAMUSCULAR | Status: AC
  Filled 2019-10-23: qty 1

## 2019-10-23 MED ORDER — 0.9 % SODIUM CHLORIDE (POUR BTL) OPTIME
TOPICAL | Status: DC | PRN
  Administered 2019-10-23: 1000 mL

## 2019-10-23 MED ORDER — OXYCODONE HCL 5 MG PO TABS: 5.0000 mg | ORAL_TABLET | Freq: Once | ORAL | Status: DC | PRN

## 2019-10-23 MED ORDER — OXYCODONE HCL 5 MG/5ML PO SOLN: 5.0000 mg | Freq: Once | ORAL | Status: DC | PRN

## 2019-10-23 MED ORDER — SUCCINYLCHOLINE CHLORIDE 20 MG/ML IJ SOLN
INTRAMUSCULAR | Status: DC | PRN
  Administered 2019-10-23: 140 mg via INTRAVENOUS

## 2019-10-23 MED ORDER — PROPOFOL 10 MG/ML IV BOLUS
INTRAVENOUS | Status: DC | PRN
  Administered 2019-10-23: 200 mg via INTRAVENOUS

## 2019-10-23 MED ORDER — FENTANYL CITRATE (PF) 250 MCG/5ML IJ SOLN
INTRAMUSCULAR | Status: AC
  Filled 2019-10-23: qty 5

## 2019-10-23 MED ORDER — ONDANSETRON HCL 4 MG/2ML IJ SOLN
INTRAMUSCULAR | Status: DC | PRN
  Administered 2019-10-23: 4 mg via INTRAVENOUS

## 2019-10-23 MED ORDER — ROCURONIUM BROMIDE 10 MG/ML (PF) SYRINGE
PREFILLED_SYRINGE | INTRAVENOUS | Status: AC
  Filled 2019-10-23: qty 10

## 2019-10-23 SURGICAL SUPPLY — 18 items
CANISTER SUCT 3000ML PPV (MISCELLANEOUS) ×3 IMPLANT
COVER SURGICAL LIGHT HANDLE (MISCELLANEOUS) ×2 IMPLANT
DRSG TELFA 3X8 NADH (GAUZE/BANDAGES/DRESSINGS) ×3 IMPLANT
ELECT REM PT RETURN 9FT ADLT (ELECTROSURGICAL) ×3
ELECTRODE REM PT RTRN 9FT ADLT (ELECTROSURGICAL) ×1 IMPLANT
GAUZE 4X4 16PLY RFD (DISPOSABLE) ×2 IMPLANT
GAUZE SPONGE 4X4 12PLY STRL (GAUZE/BANDAGES/DRESSINGS) ×2 IMPLANT
GLOVE BIOGEL M 7.0 STRL (GLOVE) ×3 IMPLANT
GOWN STRL REUS W/ TWL LRG LVL3 (GOWN DISPOSABLE) ×2 IMPLANT
GOWN STRL REUS W/TWL LRG LVL3 (GOWN DISPOSABLE) ×6
KIT TURNOVER KIT B (KITS) ×3 IMPLANT
NS IRRIG 1000ML POUR BTL (IV SOLUTION) ×3 IMPLANT
PAD ARMBOARD 7.5X6 YLW CONV (MISCELLANEOUS) ×6 IMPLANT
PAD DRESSING TELFA 3X8 NADH (GAUZE/BANDAGES/DRESSINGS) ×1 IMPLANT
PENCIL SMOKE EVACUATOR (MISCELLANEOUS) ×1 IMPLANT
SUT ETHILON 4 0 PS 2 18 (SUTURE) ×2 IMPLANT
TRAY ENT MC OR (CUSTOM PROCEDURE TRAY) ×3 IMPLANT
WATER STERILE IRR 1000ML POUR (IV SOLUTION) ×3 IMPLANT

## 2019-10-23 NOTE — Progress Notes (Signed)
PROGRESS NOTE    Brett Wu  YQM:[id number] DOB: 08-21-1995 DOA: 10/21/2019 PCP: Patient, No Pcp Per    Chief Complaint  Patient presents with  . Abscess    Brief Narrative:  HPI per Dr. Leafy Half 24 year old male with past medical history of nicotine dependence who presents to [facility] emergency department with complaints of facial pain.  Patient explains that 4 days ago he began to try and pick ingrown hairs from his chin.  In the days that followed, patient began to develop multiple pimples over his chin that he frequently attempted to pop unsuccessfully.  Over the next several days patient began to develop pain of the chin, burning to stinging in quality, progressively worsening until it became severe in intensity.  Pain is worse with attempting to open his mouth or ingest food.  Pain radiates down the neck.  As the patient symptoms continue to worsen over the next several days patient also began to develop generalized weakness and poor oral intake, primarily due to having difficulty opening his mouth and chewing food.  Patient eventually presented to [facility] emergency department for evaluation.  Upon evaluation in the emergency department patient was clinically found to have significant cellulitis of the chin neck and face with areas of spontaneous purulent drainage.  CT imaging of the soft tissues of the neck was performed revealing a left facial cellulitis with subcutaneous purulent collection and reactive cervical lymphadenopathy.  These images were reviewed with Dr. Annalee Genta with ENT who felt that surgical intervention was not necessary and recommended hospitalization with intravenous antibiotics and to formally consult ENT later in the hospitalization if patient clinically worsened.  Intravenous vancomycin was initiated.  The hospitalist group was then called to assess the patient for admission the hospital.  Assessment & Plan:   Principal Problem:    Cellulitis of face Active Problems:   Abscess of chin   Nicotine dependence, cigarettes, uncomplicated   Lactic acidosis  1 facial cellulitis and abscess of the chin Patient presented with worsening symptoms from an area of ingrown hairs which patient had tried to manipulate.  Patient now noted to have abscess with soft tissue swelling.  Patient noted to have induration on chin area.  ENT consulted and patient underwent I and D of chin abscess this morning with purulent discharge noted.  Spontaneous purulence noted on admission was cultured with preliminary results of few staph aureus.  Continue IV vancomycin pending finalization of culture results.  Pain management.  Supportive care.  ENT following and appreciate input and recommendations.  2.  Nicotine dependence Continue nicotine patch.  Tobacco cessation stressed to patient.  3.  Lactic acidosis Likely secondary to problem #1 dehydration.  Improved.  4.  Dehydration Hydrated with IV fluids.   DVT prophylaxis: Lovenox Code Status: Full Family Communication: Updated patient.  No family at bedside. Disposition:   Status is: Inpatient    Dispo: The patient is from: Home              Anticipated d/c is to: Home              Anticipated d/c date is: To be determined.              Patient currently with a facial cellulitis and abscess status post incision and drainage today.  Patient on IV antibiotics.       Consultants:   ENT: Dr. Annalee Genta 10/22/2019  Procedures:  CT soft tissue neck 10/22/2019  Incision and drainage  of facial abscess per Dr. Annalee Genta, ENT 10/23/2019  Antimicrobials:   IV vancomycin 10/21/2019>>>>>>>   Subjective: Drowsy.  Just returned from the OR after I and D.  Complains of pain around chin area.  Denies shortness of breath.  Denies chest pain.  Objective: Vitals:   10/23/19 1200 10/23/19 1215 10/23/19 1230 10/23/19 1502  BP: (!) 148/98 (!) 148/91 135/89 139/87  Pulse: 100 (!) 103 96 100    Resp: 13 13 12 16   Temp: 98.4 F (36.9 C) 99.4 F (37.4 C) 98.7 F (37.1 C) 98.6 F (37 C)  TempSrc:  Oral Oral Oral  SpO2: 94% 99% 95% 96%  Weight:      Height:        Intake/Output Summary (Last 24 hours) at 10/23/2019 1654 Last data filed at 10/23/2019 1500 Gross per 24 hour  Intake 1301.27 ml  Output 410 ml  Net 891.27 ml   Filed Weights   10/21/19 2200  Weight: 81.6 kg    Examination:  General exam: Drowsy Respiratory system: Clear to auscultation anterior lung fields. Respiratory effort normal. Cardiovascular system: S1 & S2 heard, RRR. No JVD, murmurs, rubs, gallops or clicks. No pedal edema. Gastrointestinal system: Abdomen is nondistended, soft and nontender. No organomegaly or masses felt. Normal bowel sounds heard. Central nervous system: Alert and oriented. No focal neurological deficits. Extremities: Symmetric 5 x 5 power. Skin: No rashes, lesions or ulcers Psychiatry: Judgement and insight appear normal. Mood & affect appropriate.     Data Reviewed: I have personally reviewed following labs and imaging studies  CBC: Recent Labs  Lab 10/21/19 1954 10/22/19 0956  WBC 9.8 8.6  NEUTROABS 8.3* 6.5  HGB 17.1* 17.0  HCT 49.1 48.5  MCV 90.9 90.3  PLT 179 177    Basic Metabolic Panel: Recent Labs  Lab 10/21/19 1954 10/22/19 0956  NA 133* 138  K 3.6 4.0  CL 98 101  CO2 24 27  GLUCOSE 248* 128*  BUN <5* 5*  CREATININE 0.92 0.92  CALCIUM 8.9 8.8*  MG  --  2.0    GFR: Estimated Creatinine Clearance: 121.8 mL/min (by C-G formula based on SCr of 0.92 mg/dL).  Liver Function Tests: Recent Labs  Lab 10/21/19 1954  AST 36  ALT 106*  ALKPHOS 116  BILITOT 1.6*  PROT 6.9  ALBUMIN 3.8    CBG: No results for input(s): GLUCAP in the last 168 hours.   Recent Results (from the past 240 hour(s))  Culture, blood (Routine x 2)     Status: None (Preliminary result)   Collection Time: 10/21/19  7:54 PM   Specimen: BLOOD  Result Value Ref Range  Status   Specimen Description BLOOD LEFT ARM  Final   Special Requests   Final    BOTTLES DRAWN AEROBIC AND ANAEROBIC Blood Culture adequate volume   Culture   Final    NO GROWTH 2 DAYS Performed at [facility], [address], [place], [place]    Report Status PENDING  Incomplete  Culture, blood (Routine x 2)     Status: None (Preliminary result)   Collection Time: 10/21/19  7:54 PM   Specimen: BLOOD LEFT HAND  Result Value Ref Range Status   Specimen Description BLOOD LEFT HAND  Final   Special Requests   Final    BOTTLES DRAWN AEROBIC ONLY Blood Culture adequate volume   Culture   Final    NO GROWTH 2 DAYS Performed at [facility], [address], [place], [place] [address]    Report Status PENDING  Incomplete  SARS Coronavirus 2 by RT PCR (hospital order, performed in [facility] hospital lab) Nasopharyngeal Nasopharyngeal Swab     Status: None   Collection Time: 10/22/19 12:57 AM   Specimen: Nasopharyngeal Swab  Result Value Ref Range Status   SARS Coronavirus 2 NEGATIVE NEGATIVE Final    Comment: (NOTE) SARS-CoV-2 target nucleic acids are NOT DETECTED.  The SARS-CoV-2 RNA is generally detectable in upper and lower respiratory specimens during the acute phase of infection. The lowest concentration of SARS-CoV-2 viral copies this assay can detect is 250 copies / mL. A negative result does not preclude SARS-CoV-2 infection and should not be used as the sole basis for treatment or other patient management decisions.  A negative result may occur with improper specimen collection / handling, submission of specimen other than nasopharyngeal swab, presence of viral mutation(s) within the areas targeted by this assay, and inadequate number of viral copies (<250 copies / mL). A negative result must be combined with clinical observations, patient history, and epidemiological information.  Fact Sheet for Patients:    StrictlyIdeas.no  Fact Sheet for Healthcare Providers: BankingDealers.co.za  This test is not yet approved or  cleared by the [place] FDA and has been authorized for detection and/or diagnosis of SARS-CoV-2 by FDA under an Emergency Use Authorization (EUA).  This EUA will remain in effect (meaning this test can be used) for the duration of the COVID-19 declaration under Section 564(b)(1) of the Act, 21 U.S.C. section 360bbb-3(b)(1), unless the authorization is terminated or revoked sooner.  Performed at [facility], [place] [address] [place], [place] [address]   Aerobic Culture (superficial specimen)     Status: None (Preliminary result)   Collection Time: 10/22/19  3:03 AM   Specimen: Wound  Result Value Ref Range Status   Specimen Description WOUND FACE  Final   Special Requests CHIN  Final   Gram Stain   Final    RARE WBC PRESENT, PREDOMINANTLY PMN NO ORGANISMS SEEN    Culture   Final    FEW STAPHYLOCOCCUS AUREUS SUSCEPTIBILITIES TO FOLLOW Performed at [facility], [place] [address], [place], [place] [address]    Report Status PENDING  Incomplete  MRSA PCR Screening     Status: Abnormal   Collection Time: 10/22/19  5:15 AM   Specimen: Nasal Mucosa; Nasopharyngeal  Result Value Ref Range Status   MRSA by PCR POSITIVE (A) NEGATIVE Final    Comment:        The GeneXpert MRSA Assay (FDA approved for NASAL specimens only), is one component of a comprehensive MRSA colonization surveillance program. It is not intended to diagnose MRSA infection nor to guide or monitor treatment for MRSA infections. RESULT CALLED TO, READ BACK BY AND VERIFIED WITH: ABIERA,L RN 10/22/2019 AT 2130 SKEEN,P Performed at [facility], [place] [address] [place], [place] [address]   Surgical pcr screen     Status: Abnormal   Collection Time: 10/22/19  4:38 PM   Specimen: Nasal Mucosa; Nasal Swab  Result Value Ref Range  Status   MRSA, PCR POSITIVE (A) NEGATIVE Final    Comment: RESULT CALLED TO, READ BACK BY AND VERIFIED WITH: K HARTGROVE RN 2026 10/22/19 A BROWNING    Staphylococcus aureus POSITIVE (A) NEGATIVE Final    Comment: (NOTE) The Xpert SA Assay (FDA approved for NASAL specimens in patients 75 years of age and older), is one  component of a comprehensive surveillance program. It is not intended to diagnose infection nor to guide or monitor treatment. Performed at [facility], [address], [place], [place] [address]          Radiology Studies: CT Soft Tissue Neck W Contrast  Result Date: 10/22/2019 CLINICAL DATA:  Facial cellulitis EXAM: CT NECK WITH CONTRAST TECHNIQUE: Multidetector CT imaging of the neck was performed using the standard protocol following the bolus administration of intravenous contrast. CONTRAST:  27mL OMNIPAQUE IOHEXOL 300 MG/ML  SOLN COMPARISON:  None. FINDINGS: PHARYNX AND LARYNX: The nasopharynx, oropharynx and larynx are normal. Visible portions of the oral cavity, tongue base and floor of mouth are normal. Normal epiglottis, vallecula and pyriform sinuses. The larynx is normal. No retropharyngeal abscess, effusion or lymphadenopathy. SALIVARY GLANDS: Mildly enlarged left submandibular gland, likely reactive. Other salivary glands are normal. THYROID: Normal. LYMPH NODES: Bilateral level 1A and 1B reactive cervical lymph nodes. VASCULAR: Major cervical vessels are patent. LIMITED INTRACRANIAL: Normal. VISUALIZED ORBITS: Normal. MASTOIDS AND VISUALIZED PARANASAL SINUSES: No fluid levels or advanced mucosal thickening. No mastoid effusion. SKELETON: No bony spinal canal stenosis. No lytic or blastic lesions. UPPER CHEST: Clear. OTHER: There is heterogeneous induration of the subcutaneous tissues anterior to the left mandibular body. There is a small amount of internal intermediate density material, likely purulence. There is thickening of the platysma muscle.  IMPRESSION: 1. Left facial cellulitis with subcutaneous purulent collection. 2. Reactive cervical lymphadenopathy. Electronically Signed   By: Deatra Robinson M.D.   On: 10/22/2019 01:08        Scheduled Meds: . albuterol  2.5 mg Nebulization Once  . Chlorhexidine Gluconate Cloth  6 each Topical Q0600  . enoxaparin (LOVENOX) injection  40 mg Subcutaneous Q24H  . mupirocin ointment  1 application Nasal BID  . nicotine  21 mg Transdermal Daily   Continuous Infusions: . vancomycin 1,500 mg (10/23/19 1500)     LOS: 1 day    Time spent: 35 minutes    Ramiro Harvest, MD [facility]   To contact the attending provider between 7A-7P or the covering provider during after hours 7P-7A, please log into the web site [email or web address] and access using universal [place] password for that web site. If you do not have the password, please call the hospital operator.  10/23/2019, 4:54 PM

## 2019-10-23 NOTE — Progress Notes (Signed)
Patient transported via bed to OR at this time for surgical procedure

## 2019-10-23 NOTE — Progress Notes (Signed)
   ENT Progress Note: Procedure(s): I&D Facial abscess   Subjective: C/O pain  Objective: Vital signs in last 24 hours: Temp:  [98.2 F (36.8 C)-98.5 F (36.9 C)] 98.4 F (36.9 C) (06/12 0435) Pulse Rate:  [97-99] 97 (06/12 0435) Resp:  [17-18] 17 (06/12 0435) BP: (134-137)/(80-84) 134/80 (06/12 0435) SpO2:  [98 %-99 %] 98 % (06/12 0435) Weight change:  Last BM Date: 10/21/19  Intake/Output from previous day: 06/11 0701 - 06/12 0700 In: 2754.3 [P.O.:480; I.V.:1373; IV Piggyback:901.3] Out: 1175 [Urine:1175] Intake/Output this shift: No intake/output data recorded.  Labs: Recent Labs    10/21/19 1954 10/22/19 0956  WBC 9.8 8.6  HGB 17.1* 17.0  HCT 49.1 48.5  PLT 179 177   Recent Labs    10/21/19 1954 10/22/19 0956  NA 133* 138  K 3.6 4.0  CL 98 101  CO2 24 27  GLUCOSE 248* 128*  BUN <5* 5*  CALCIUM 8.9 8.8*    Studies/Results: CT Soft Tissue Neck W Contrast  Result Date: 10/22/2019 CLINICAL DATA:  Facial cellulitis EXAM: CT NECK WITH CONTRAST TECHNIQUE: Multidetector CT imaging of the neck was performed using the standard protocol following the bolus administration of intravenous contrast. CONTRAST:  71mL OMNIPAQUE IOHEXOL 300 MG/ML  SOLN COMPARISON:  None. FINDINGS: PHARYNX AND LARYNX: The nasopharynx, oropharynx and larynx are normal. Visible portions of the oral cavity, tongue base and floor of mouth are normal. Normal epiglottis, vallecula and pyriform sinuses. The larynx is normal. No retropharyngeal abscess, effusion or lymphadenopathy. SALIVARY GLANDS: Mildly enlarged left submandibular gland, likely reactive. Other salivary glands are normal. THYROID: Normal. LYMPH NODES: Bilateral level 1A and 1B reactive cervical lymph nodes. VASCULAR: Major cervical vessels are patent. LIMITED INTRACRANIAL: Normal. VISUALIZED ORBITS: Normal. MASTOIDS AND VISUALIZED PARANASAL SINUSES: No fluid levels or advanced mucosal thickening. No mastoid effusion. SKELETON: No bony  spinal canal stenosis. No lytic or blastic lesions. UPPER CHEST: Clear. OTHER: There is heterogeneous induration of the subcutaneous tissues anterior to the left mandibular body. There is a small amount of internal intermediate density material, likely purulence. There is thickening of the platysma muscle. IMPRESSION: 1. Left facial cellulitis with subcutaneous purulent collection. 2. Reactive cervical lymphadenopathy. Electronically Signed   By: Deatra Robinson M.D.   On: 10/22/2019 01:08     PHYSICAL EXAM: Erythema and swelling with purulent d/c   Assessment/Plan: Pt for I&D chin/cutaneous abscess    Osborn Coho 10/23/2019, 8:35 AM

## 2019-10-23 NOTE — Progress Notes (Signed)
Pt transported via bed by OR nurse to room. Pt is alert and oriented x4 with slight drowsiness. pts VS stable at this time. Pt reports severe pain and denies shortness of breath. Will continue to monitor patient status.

## 2019-10-23 NOTE — Plan of Care (Signed)
  Problem: Education: Goal: Knowledge of General Education information will improve Description Including pain rating scale, medication(s)/side effects and non-pharmacologic comfort measures Outcome: Progressing   

## 2019-10-23 NOTE — Anesthesia Preprocedure Evaluation (Signed)
Anesthesia Evaluation  Patient identified by MRN, date of birth, ID band Patient awake    Reviewed: Allergy & Precautions, NPO status , Patient's Chart, lab work & pertinent test results  Airway Mallampati: III   Neck ROM: Limited  Mouth opening: Limited Mouth Opening  Dental  (+) Teeth Intact, Dental Advisory Given   Pulmonary Current Smoker,    breath sounds clear to auscultation       Cardiovascular  Rhythm:Regular Rate:Normal     Neuro/Psych    GI/Hepatic   Endo/Other    Renal/GU      Musculoskeletal   Abdominal   Peds  Hematology   Anesthesia Other Findings   Reproductive/Obstetrics                             Anesthesia Physical Anesthesia Plan  ASA: II  Anesthesia Plan: General   Post-op Pain Management:    Induction: Intravenous, Rapid sequence and Cricoid pressure planned  PONV Risk Score and Plan:   Airway Management Planned: Oral ETT  Additional Equipment:   Intra-op Plan:   Post-operative Plan: Extubation in OR  Informed Consent: I have reviewed the patients History and Physical, chart, labs and discussed the procedure including the risks, benefits and alternatives for the proposed anesthesia with the patient or authorized representative who has indicated his/her understanding and acceptance.     Dental advisory given  Plan Discussed with: CRNA and Anesthesiologist  Anesthesia Plan Comments:         Anesthesia Quick Evaluation

## 2019-10-23 NOTE — Transfer of Care (Signed)
Immediate Anesthesia Transfer of Care Note  Patient: Brett Wu  Procedure(s) Performed: I&D Facial abscess (N/A )  Patient Location: PACU  Anesthesia Type:General  Level of Consciousness: awake and oriented  Airway & Oxygen Therapy: Patient Spontanous Breathing  Post-op Assessment: Report given to RN  Post vital signs: Reviewed and stable  Last Vitals:  Vitals Value Taken Time  BP 135/86 10/23/19 1107  Temp    Pulse 96 10/23/19 1109  Resp 16 10/23/19 1109  SpO2 100 % 10/23/19 1109  Vitals shown include unvalidated device data.  Last Pain:  Vitals:   10/23/19 0909  TempSrc:   PainSc: 7       Patients Stated Pain Goal: 2 (10/22/19 1631)  Complications: No complications documented.

## 2019-10-23 NOTE — Anesthesia Procedure Notes (Signed)
Procedure Name: Intubation Date/Time: 10/23/2019 10:19 AM Performed by: Barrington Ellison, CRNA Pre-anesthesia Checklist: Patient identified, Emergency Drugs available, Suction available and Patient being monitored Patient Re-evaluated:Patient Re-evaluated prior to induction Oxygen Delivery Method: Circle System Utilized Preoxygenation: Pre-oxygenation with 100% oxygen Induction Type: IV induction, Rapid sequence and Cricoid Pressure applied Ventilation: Mask ventilation without difficulty Laryngoscope Size: Mac Grade View: Grade II Tube type: Oral Tube size: 7.5 mm Number of attempts: 1 Airway Equipment and Method: Stylet and Oral airway Placement Confirmation: ETT inserted through vocal cords under direct vision,  positive ETCO2 and breath sounds checked- equal and bilateral Secured at: 22 cm Tube secured with: Tape Dental Injury: Teeth and Oropharynx as per pre-operative assessment

## 2019-10-23 NOTE — Plan of Care (Signed)

## 2019-10-23 NOTE — Op Note (Signed)
Operative Note: INCISION & DRAINAGE FACIAL ABSCESS  Patient: Brett Wu  Medical record number: [id number]  Date:10/23/2019  Pre-operative Indications: Facial/soft tissue abscess  Postoperative Indications: Same  Surgical Procedure: Incision and Drainage of facial abscess  Anesthesia: GET  Surgeon: Barbee Cough, M.D.  Assist: None  Complications: None  EBL: Minimal   Brief History: The patient is a 24 y.o. male with a history of pain and swelling involving the soft tissue overlying the chin.  The patient reports a folliculitis which he manipulated more than 1 week ago.  Over the last 7 days the area has gradually increased in swelling and erythema.  Patient admitted to [facility] through the emergency department for intravenous antibiotics.  ENT service consulted for evaluation of the patient's abscess.  Patient initially scheduled for incision and drainage in the operating room on 10/22/2019 but the patient ate and surgery had to be delayed until the morning of 10/23/2019.  CT scan of the neck showed loculated fluid collection in the deep tissue overlying the chin with significant inflammatory changes and edema.  Given the patient's history and findings, I recommended incision and drainage of abscess under general anesthesia, risks and benefits were discussed in detail with the patient and their family. They understand and agree with our plan for surgery which is scheduled at [facility] on an elective basis.  Surgical Procedure: The patient is brought to the operating room on 10/23/2019 and placed in supine position on the operating table. General endotracheal anesthesia was established without difficulty. When the patient was adequately anesthetized, surgical timeout was performed and correct identification of the patient and the surgical procedure. The patient was positioned and prepped and draped in sterile fashion.  Patient's preoperative imaging was reviewed.  The  patient had significant erythema and swelling with purulent material draining through the skin overlying the chin.  A #15 scalpel was used to make an incision in the skin and underlying deep subcutaneous tissue.  This is carried through the soft tissue into the abscess cavity.  A significant amount of purulent material was expressed.  The abscess cavity was then thoroughly irrigated with sterile saline solution.  A Penrose drain was inserted to the depth of the abscess cavity and sutured to the skin with interrupted 3-0 Ethilon suture.  The patient's wound was then dressed with Telfa gauze, 4 x 4 gauze and a 4 inch Kerlix wrap.  An orogastric tube was passed and stomach contents were aspirated. Patient was awakened from anesthetic and transferred from the operating room to the recovery room in stable condition. There were no complications and blood loss was minimal.   Barbee Cough, M.D. [facility] 10/23/2019

## 2019-10-24 ENCOUNTER — Encounter (HOSPITAL_COMMUNITY): Payer: Self-pay | Admitting: Otolaryngology

## 2019-10-24 LAB — BASIC METABOLIC PANEL
Anion gap: 11 (ref 5–15)
BUN: 11 mg/dL (ref 6–20)
CO2: 26 mmol/L (ref 22–32)
Calcium: 8.6 mg/dL — ABNORMAL LOW (ref 8.9–10.3)
Chloride: 101 mmol/L (ref 98–111)
Creatinine, Ser: 0.81 mg/dL (ref 0.61–1.24)
GFR calc Af Amer: >60 mL/min (ref >60)
GFR calc non Af Amer: >60 mL/min (ref >60)
Glucose, Bld: 113 mg/dL — ABNORMAL HIGH (ref 70–99)
Potassium: 4 mmol/L (ref 3.5–5.1)
Sodium: 138 mmol/L (ref 135–145)

## 2019-10-24 LAB — CBC WITH DIFFERENTIAL/PLATELET
Abs Immature Granulocytes: 0.03 10*3/uL (ref 0.00–0.07)
Basophils Absolute: 0.1 10*3/uL (ref 0.0–0.1)
Basophils Relative: 1 %
Eosinophils Absolute: 0.1 10*3/uL (ref 0.0–0.5)
Eosinophils Relative: 1 %
HCT: 46.1 % (ref 39.0–52.0)
Hemoglobin: 16.1 g/dL (ref 13.0–17.0)
Immature Granulocytes: 0 %
Lymphocytes Relative: 18 %
Lymphs Abs: 1.4 10*3/uL (ref 0.7–4.0)
MCH: 31.5 pg (ref 26.0–34.0)
MCHC: 34.9 g/dL (ref 30.0–36.0)
MCV: 90.2 fL (ref 80.0–100.0)
Monocytes Absolute: 0.7 10*3/uL (ref 0.1–1.0)
Monocytes Relative: 9 %
Neutro Abs: 5.7 10*3/uL (ref 1.7–7.7)
Neutrophils Relative %: 71 %
Platelets: 261 10*3/uL (ref 150–400)
RBC: 5.11 MIL/uL (ref 4.22–5.81)
RDW: 11.9 % (ref 11.5–15.5)
WBC: 8 10*3/uL (ref 4.0–10.5)
nRBC: 0 % (ref 0.0–0.2)

## 2019-10-24 LAB — AEROBIC CULTURE W GRAM STAIN (SUPERFICIAL SPECIMEN)

## 2019-10-24 NOTE — Progress Notes (Signed)
Pharmacy Antibiotic Note  Brett Wu is a 24 y.o. male admitted on 10/21/2019 with facial abscess and cellulitis.  Pharmacy has been consulted for vancomycin dosing.  S/p I&D on 6/12, and wound culture growing MRSA. Today, patient is afebrile, WBC WNL, SCr stable. Patient improving and current plan is to continue current care and d/c in 24-48 hrs if continued improvement.  Plan: Continue vancomycin 1500mg  IV q12h (goal trough 10-15) Monitor clinical picture, renal function, vanc trough at Css if indicated F/U C&S, abx de-escalation, LOT   Height: 5\' 8"  (172.7 cm) Weight: 81.6 kg (180 lb) (per last visit) IBW/kg (Calculated) : 68.4  Temp (24hrs), Avg:98.5 F (36.9 C), Min:98.3 F (36.8 C), Max:98.7 F (37.1 C)  Recent Labs  Lab 10/21/19 1954 10/22/19 0003 10/22/19 0956 10/23/19 1856 10/24/19 0309  WBC 9.8  --  8.6 6.8 8.0  CREATININE 0.92  --  0.92 0.89 0.81  LATICACIDVEN 2.3* 0.9  --   --   --     Estimated Creatinine Clearance: 138.4 mL/min (by C-G formula based on SCr of 0.81 mg/dL).    No Known Allergies  Antimicrobials this admission: Vanc 6/10>>  Microbiology results: 6/11 chin wound: MRSA 6/10 blood: ngtd 6/11 MRSC PCR positive   8/11, PharmD PGY2 Pharmacy Resident Phone [phone]  10/24/2019   1:00 PM

## 2019-10-24 NOTE — Progress Notes (Signed)
   ENT Progress Note: POD #1 s/p Procedure(s): I&D Facial abscess   Subjective: Improved pain  Objective: Vital signs in last 24 hours: Temp:  [97.7 F (36.5 C)-99.4 F (37.4 C)] 98.7 F (37.1 C) (06/13 0621) Pulse Rate:  [88-103] 88 (06/13 0621) Resp:  [11-18] 18 (06/13 0621) BP: (115-159)/(65-108) 132/76 (06/13 0621) SpO2:  [93 %-99 %] 97 % (06/13 0621) Weight change:  Last BM Date: 10/21/19  Intake/Output from previous day: 06/12 0701 - 06/13 0700 In: 400 [I.V.:400] Out: 1810 [Urine:1800; Blood:10] Intake/Output this shift: No intake/output data recorded.  Labs: Recent Labs    10/23/19 1856 10/24/19 0309  WBC 6.8 8.0  HGB 16.9 16.1  HCT 47.7 46.1  PLT 229 261   Recent Labs    10/23/19 1856 10/24/19 0309  NA 138 138  K 4.4 4.0  CL 100 101  CO2 27 26  GLUCOSE 211* 113*  BUN 8 11  CALCIUM 8.9 8.6*    Studies/Results: No results found.   PHYSICAL EXAM: Improving erythema and swelling JP inplace - mod d/c   Assessment/Plan: Pt improving after I&D and IV Abx Cont current care and plan d/c in 24-48 hrs if cont improvement    Brett Wu 10/24/2019, 8:55 AM

## 2019-10-24 NOTE — Social Work (Signed)
CSW attempted to acknowledge SA consult however was unable to reach patient via phone.  CSW left follow up contact information for a resource in discharge instructions for client.

## 2019-10-24 NOTE — Anesthesia Postprocedure Evaluation (Signed)
Anesthesia Post Note  Patient: Brett Wu  Procedure(s) Performed: I&D Facial abscess (N/A )     Patient location during evaluation: PACU Anesthesia Type: General Level of consciousness: awake and alert Pain management: pain level controlled Vital Signs Assessment: post-procedure vital signs reviewed and stable Respiratory status: spontaneous breathing, nonlabored ventilation, respiratory function stable and patient connected to nasal cannula oxygen Cardiovascular status: blood pressure returned to baseline and stable Postop Assessment: no apparent nausea or vomiting Anesthetic complications: no   No complications documented.  Last Vitals:  Vitals:   10/23/19 1502 10/23/19 2128  BP: 139/87 135/78  Pulse: 100 97  Resp: 16 16  Temp: 37 C 37.1 C  SpO2: 96% 98%    Last Pain:  Vitals:   10/23/19 2236  TempSrc:   PainSc: Asleep                 Ikeem Cleckler COKER

## 2019-10-24 NOTE — Progress Notes (Signed)
PROGRESS NOTE    Brett Wu  NWG:[id number] DOB: 1995-06-07 DOA: 10/21/2019 PCP: Patient, No Pcp Per    Chief Complaint  Patient presents with  . Abscess    Brief Narrative:  HPI per Dr. Cyd Silence 24 year old male with past medical history of nicotine dependence who presents to [facility] emergency department with complaints of facial pain.  Patient explains that 4 days ago he began to try and pick ingrown hairs from his chin.  In the days that followed, patient began to develop multiple pimples over his chin that he frequently attempted to pop unsuccessfully.  Over the next several days patient began to develop pain of the chin, burning to stinging in quality, progressively worsening until it became severe in intensity.  Pain is worse with attempting to open his mouth or ingest food.  Pain radiates down the neck.  As the patient symptoms continue to worsen over the next several days patient also began to develop generalized weakness and poor oral intake, primarily due to having difficulty opening his mouth and chewing food.  Patient eventually presented to [facility] emergency department for evaluation.  Upon evaluation in the emergency department patient was clinically found to have significant cellulitis of the chin neck and face with areas of spontaneous purulent drainage.  CT imaging of the soft tissues of the neck was performed revealing a left facial cellulitis with subcutaneous purulent collection and reactive cervical lymphadenopathy.  These images were reviewed with Dr. Wilburn Cornelia with ENT who felt that surgical intervention was not necessary and recommended hospitalization with intravenous antibiotics and to formally consult ENT later in the hospitalization if patient clinically worsened.  Intravenous vancomycin was initiated.  The hospitalist group was then called to assess the patient for admission the hospital.  Assessment & Plan:   Principal Problem:    Cellulitis of face Active Problems:   Abscess of chin   Nicotine dependence, cigarettes, uncomplicated   Lactic acidosis   Dehydration  1 facial cellulitis and abscess of the chin Patient presented with worsening symptoms from an area of ingrown hairs which patient had tried to manipulate.  Patient now noted to have abscess with soft tissue swelling.  Patient noted to have induration on chin area.  Improving clinically post I and D.  ENT consulted and patient underwent I and D of chin abscess (10/23/2019) with purulent discharge noted.  Spontaneous purulence noted on admission was cultured with results positive for MRSA.  Surgical PCR screen MRSA was positive.  Blood cultures pending with no growth to date.  Continue IV vancomycin.  Pain management.  Supportive care.  Once patient ready for discharge we will transition to oral doxycycline to complete a 10 to 14-day course of treatment. ENT following and appreciate input and recommendations.  2.  Nicotine dependence Continue nicotine patch.  Tobacco cessation stressed to patient.  3.  Lactic acidosis Likely secondary to problem #1, dehydration.  Improved.  4.  Dehydration Improved with hydration.  Now tolerating oral diet.    DVT prophylaxis: Lovenox Code Status: Full Family Communication: Updated patient.  No family at bedside. Disposition:   Status is: Inpatient    Dispo: The patient is from: Home              Anticipated d/c is to: Home              Anticipated d/c date is: Probably in 1 to 2 days.  Patient currently with a facial cellulitis and abscess status post incision and drainage 10/23/2019.  Patient on IV antibiotics.       Consultants:   ENT: Dr. Annalee Genta 10/22/2019  Procedures:  CT soft tissue neck 10/22/2019  Incision and drainage of facial abscess per Dr. Annalee Genta, ENT 10/23/2019  Antimicrobials:   IV vancomycin 10/21/2019>>>>>>>   Subjective: Patient sleeping but arousable.  More alert.   Asking for his lunch.  States pain around chin area improving.  Denies any chest pain.  No shortness of breath.   Objective: Vitals:   10/23/19 2128 10/24/19 0032 10/24/19 0621 10/24/19 0859  BP: 135/78 115/65 132/76 124/85  Pulse: 97 98 88 87  Resp: 16 16 18 18   Temp: 98.7 F (37.1 C) 98.4 F (36.9 C) 98.7 F (37.1 C) 98.3 F (36.8 C)  TempSrc: Oral Oral Oral Oral  SpO2: 98% 96% 97% 97%  Weight:      Height:        Intake/Output Summary (Last 24 hours) at 10/24/2019 1248 Last data filed at 10/24/2019 0035 Gross per 24 hour  Intake 0 ml  Output 1800 ml  Net -1800 ml   Filed Weights   10/21/19 2200  Weight: 81.6 kg    Examination:  General exam: Alert.  Decreasing erythema and swelling on the chin.  Penrose in place. Respiratory system: Lungs clear to auscultation bilaterally.  No wheezes, no crackles, no rhonchi.  Normal respiratory effort.   Cardiovascular system: Regular rate rhythm no murmurs rubs or gallops.  No JVD.  No lower extremity edema.  Gastrointestinal system: Abdomen is soft, nontender, nondistended, positive bowel sounds.  No rebound.  No guarding.  Central nervous system: Alert and oriented. No focal neurological deficits. Extremities: Symmetric 5 x 5 power. Skin: No rashes, lesions or ulcers Psychiatry: Judgement and insight appear normal. Mood & affect appropriate.     Data Reviewed: I have personally reviewed following labs and imaging studies  CBC: Recent Labs  Lab 10/21/19 1954 10/22/19 0956 10/23/19 1856 10/24/19 0309  WBC 9.8 8.6 6.8 8.0  NEUTROABS 8.3* 6.5 6.3 5.7  HGB 17.1* 17.0 16.9 16.1  HCT 49.1 48.5 47.7 46.1  MCV 90.9 90.3 90.2 90.2  PLT 179 177 229 261    Basic Metabolic Panel: Recent Labs  Lab 10/21/19 1954 10/22/19 0956 10/23/19 1856 10/24/19 0309  NA 133* 138 138 138  K 3.6 4.0 4.4 4.0  CL 98 101 100 101  CO2 24 27 27 26   GLUCOSE 248* 128* 211* 113*  BUN <5* 5* 8 11  CREATININE 0.92 0.92 0.89 0.81  CALCIUM 8.9  8.8* 8.9 8.6*  MG  --  2.0  --   --     GFR: Estimated Creatinine Clearance: 138.4 mL/min (by C-G formula based on SCr of 0.81 mg/dL).  Liver Function Tests: Recent Labs  Lab 10/21/19 1954 10/23/19 1856  AST 36 24  ALT 106* 63*  ALKPHOS 116 115  BILITOT 1.6* 0.9  PROT 6.9 6.7  ALBUMIN 3.8 3.2*    CBG: No results for input(s): GLUCAP in the last 168 hours.   Recent Results (from the past 240 hour(s))  Culture, blood (Routine x 2)     Status: None (Preliminary result)   Collection Time: 10/21/19  7:54 PM   Specimen: BLOOD  Result Value Ref Range Status   Specimen Description BLOOD LEFT ARM  Final   Special Requests   Final    BOTTLES DRAWN AEROBIC AND ANAEROBIC Blood Culture adequate volume  Culture   Final    NO GROWTH 3 DAYS Performed at [facility], [address] [place], [place] [address]    Report Status PENDING  Incomplete  Culture, blood (Routine x 2)     Status: None (Preliminary result)   Collection Time: 10/21/19  7:54 PM   Specimen: BLOOD LEFT HAND  Result Value Ref Range Status   Specimen Description BLOOD LEFT HAND  Final   Special Requests   Final    BOTTLES DRAWN AEROBIC ONLY Blood Culture adequate volume   Culture   Final    NO GROWTH 3 DAYS Performed at [facility], [address], [place], [place] [address]    Report Status PENDING  Incomplete  SARS Coronavirus 2 by RT PCR (hospital order, performed in [facility] hospital lab) Nasopharyngeal Nasopharyngeal Swab     Status: None   Collection Time: 10/22/19 12:57 AM   Specimen: Nasopharyngeal Swab  Result Value Ref Range Status   SARS Coronavirus 2 NEGATIVE NEGATIVE Final    Comment: (NOTE) SARS-CoV-2 target nucleic acids are NOT DETECTED.  The SARS-CoV-2 RNA is generally detectable in upper and lower respiratory specimens during the acute phase of infection. The lowest concentration of SARS-CoV-2 viral copies this assay can detect is 250 copies / mL. A negative result does  not preclude SARS-CoV-2 infection and should not be used as the sole basis for treatment or other patient management decisions.  A negative result may occur with improper specimen collection / handling, submission of specimen other than nasopharyngeal swab, presence of viral mutation(s) within the areas targeted by this assay, and inadequate number of viral copies (<250 copies / mL). A negative result must be combined with clinical observations, patient history, and epidemiological information.  Fact Sheet for Patients:   BoilerBrush.com.cy  Fact Sheet for Healthcare Providers: https://pope.com/  This test is not yet approved or  cleared by the [place] FDA and has been authorized for detection and/or diagnosis of SARS-CoV-2 by FDA under an Emergency Use Authorization (EUA).  This EUA will remain in effect (meaning this test can be used) for the duration of the COVID-19 declaration under Section 564(b)(1) of the Act, 21 U.S.C. section 360bbb-3(b)(1), unless the authorization is terminated or revoked sooner.  Performed at [facility], [address], [place], [place] [address]   Aerobic Culture (superficial specimen)     Status: None   Collection Time: 10/22/19  3:03 AM   Specimen: Wound  Result Value Ref Range Status   Specimen Description WOUND FACE  Final   Special Requests CHIN  Final   Gram Stain   Final    RARE WBC PRESENT, PREDOMINANTLY PMN NO ORGANISMS SEEN Performed at [facility], [address] [place], [place] [address]    Culture FEW METHICILLIN RESISTANT STAPHYLOCOCCUS AUREUS  Final   Report Status 10/24/2019 FINAL  Final   Organism ID, Bacteria METHICILLIN RESISTANT STAPHYLOCOCCUS AUREUS  Final      Susceptibility   Methicillin resistant staphylococcus aureus - MIC*    CIPROFLOXACIN >=8 RESISTANT Resistant     ERYTHROMYCIN >=8 RESISTANT Resistant     GENTAMICIN <=0.5 SENSITIVE Sensitive      OXACILLIN >=4 RESISTANT Resistant     TETRACYCLINE <=1 SENSITIVE Sensitive     VANCOMYCIN <=0.5 SENSITIVE Sensitive     TRIMETH/SULFA <=10 SENSITIVE Sensitive     CLINDAMYCIN >=8 RESISTANT Resistant     RIFAMPIN <=0.5 SENSITIVE Sensitive     Inducible  Clindamycin NEGATIVE Sensitive     * FEW METHICILLIN RESISTANT STAPHYLOCOCCUS AUREUS  MRSA PCR Screening     Status: Abnormal   Collection Time: 10/22/19  5:15 AM   Specimen: Nasal Mucosa; Nasopharyngeal  Result Value Ref Range Status   MRSA by PCR POSITIVE (A) NEGATIVE Final    Comment:        The GeneXpert MRSA Assay (FDA approved for NASAL specimens only), is one component of a comprehensive MRSA colonization surveillance program. It is not intended to diagnose MRSA infection nor to guide or monitor treatment for MRSA infections. RESULT CALLED TO, READ BACK BY AND VERIFIED WITH: ABIERA,L RN 10/22/2019 AT 6440 SKEEN,P Performed at [facility], [address], [place], [place] [address]   Surgical pcr screen     Status: Abnormal   Collection Time: 10/22/19  4:38 PM   Specimen: Nasal Mucosa; Nasal Swab  Result Value Ref Range Status   MRSA, PCR POSITIVE (A) NEGATIVE Final    Comment: RESULT CALLED TO, READ BACK BY AND VERIFIED WITH: K HARTGROVE RN 2026 10/22/19 A BROWNING    Staphylococcus aureus POSITIVE (A) NEGATIVE Final    Comment: (NOTE) The Xpert SA Assay (FDA approved for NASAL specimens in patients 27 years of age and older), is one component of a comprehensive surveillance program. It is not intended to diagnose infection nor to guide or monitor treatment. Performed at [facility], [address], [place], [place] [address]          Radiology Studies: No results found.      Scheduled Meds: . albuterol  2.5 mg Nebulization Once  . Chlorhexidine Gluconate Cloth  6 each Topical Q0600  . enoxaparin (LOVENOX) injection  40 mg Subcutaneous Q24H  . mupirocin ointment  1 application Nasal BID    . nicotine  21 mg Transdermal Daily  . senna-docusate  1 tablet Oral BID   Continuous Infusions: . vancomycin Stopped (10/24/19 1007)     LOS: 2 days    Time spent: 35 minutes    Ramiro Harvest, MD [facility]   To contact the attending provider between 7A-7P or the covering provider during after hours 7P-7A, please log into the web site [email or web address] and access using universal [place] password for that web site. If you do not have the password, please call the hospital operator.  10/24/2019, 12:48 PM

## 2019-10-25 LAB — BASIC METABOLIC PANEL
Anion gap: 12 (ref 5–15)
BUN: 12 mg/dL (ref 6–20)
CO2: 24 mmol/L (ref 22–32)
Calcium: 8.7 mg/dL — ABNORMAL LOW (ref 8.9–10.3)
Chloride: 103 mmol/L (ref 98–111)
Creatinine, Ser: 0.82 mg/dL (ref 0.61–1.24)
GFR calc Af Amer: >60 mL/min (ref >60)
GFR calc non Af Amer: >60 mL/min (ref >60)
Glucose, Bld: 97 mg/dL (ref 70–99)
Potassium: 4.1 mmol/L (ref 3.5–5.1)
Sodium: 139 mmol/L (ref 135–145)

## 2019-10-25 LAB — CBC WITH DIFFERENTIAL/PLATELET
Abs Immature Granulocytes: 0.04 10*3/uL (ref 0.00–0.07)
Basophils Absolute: 0.1 10*3/uL (ref 0.0–0.1)
Basophils Relative: 1 %
Eosinophils Absolute: 0.2 10*3/uL (ref 0.0–0.5)
Eosinophils Relative: 4 %
HCT: 46.2 % (ref 39.0–52.0)
Hemoglobin: 15.9 g/dL (ref 13.0–17.0)
Immature Granulocytes: 1 %
Lymphocytes Relative: 35 %
Lymphs Abs: 2 10*3/uL (ref 0.7–4.0)
MCH: 31.5 pg (ref 26.0–34.0)
MCHC: 34.4 g/dL (ref 30.0–36.0)
MCV: 91.5 fL (ref 80.0–100.0)
Monocytes Absolute: 0.4 10*3/uL (ref 0.1–1.0)
Monocytes Relative: 7 %
Neutro Abs: 2.8 10*3/uL (ref 1.7–7.7)
Neutrophils Relative %: 52 %
Platelets: 210 10*3/uL (ref 150–400)
RBC: 5.05 MIL/uL (ref 4.22–5.81)
RDW: 12.2 % (ref 11.5–15.5)
WBC: 5.5 10*3/uL (ref 4.0–10.5)
nRBC: 0 % (ref 0.0–0.2)

## 2019-10-25 MED ORDER — SENNOSIDES-DOCUSATE SODIUM 8.6-50 MG PO TABS: 1.0000 | ORAL_TABLET | Freq: Two times a day (BID) | ORAL | Status: DC

## 2019-10-25 MED ORDER — MUPIROCIN 2 % EX OINT
TOPICAL_OINTMENT | Freq: Two times a day (BID) | CUTANEOUS | Status: DC
  Administered 2019-10-25: 1 via TOPICAL

## 2019-10-25 MED ORDER — NICOTINE 21 MG/24HR TD PT24: 21.0000 mg | MEDICATED_PATCH | Freq: Every day | TRANSDERMAL | 0 refills | Status: DC

## 2019-10-25 MED ORDER — DOXYCYCLINE MONOHYDRATE 100 MG PO TABS: 100.0000 mg | ORAL_TABLET | Freq: Two times a day (BID) | ORAL | 0 refills | Status: AC

## 2019-10-25 MED ORDER — MUPIROCIN 2 % EX OINT: TOPICAL_OINTMENT | Freq: Two times a day (BID) | CUTANEOUS | 0 refills | Status: DC

## 2019-10-25 MED ORDER — OXYCODONE-ACETAMINOPHEN 5-325 MG PO TABS: 2.0000 | ORAL_TABLET | ORAL | 0 refills | Status: DC | PRN

## 2019-10-25 MED FILL — DOXYCYCLINE HYCLATE 100 MG: 100 | 7 days supply | Qty: 14 | Fill #0

## 2019-10-25 MED FILL — MUPIROCIN 2% OINTMENT: 2 | 7 days supply | Qty: 22 | Fill #0

## 2019-10-25 MED FILL — OXYCODONE-APAP 5-325MG: 5-325 | 3 days supply | Qty: 20 | Fill #0

## 2019-10-25 NOTE — Discharge Summary (Signed)
Physician Discharge Summary  Brett Wu ZSW:[id number] DOB: February 28, 1996 DOA: 10/21/2019  PCP: Patient, No Pcp Per  Admit date: 10/21/2019 Discharge date: 10/25/2019  Time spent: 50 minutes  Recommendations for Outpatient Follow-up:  1. Follow-up with Dr. Annalee Genta, ENT in 2 weeks. 2. Follow-up at [facility] wellness center as scheduled on November 17, 2019 to establish primary care.  On follow-up patient need a basic metabolic profile done to follow-up on electrolytes and renal function in addition to a CBC.   Discharge Diagnoses:  Principal Problem:   Cellulitis of face Active Problems:   Abscess of chin   Nicotine dependence, cigarettes, uncomplicated   Lactic acidosis   Dehydration   Discharge Condition: Stable and improved  Diet recommendation: Regular  Filed Weights   10/21/19 2200  Weight: 81.6 kg    History of present illness:  HPI per Dr. Leafy Half 24 year old male with past medical history of nicotine dependence who presents to [facility] emergency department with complaints of facial pain.  Patient explained that 4 days ago he began to try and pick ingrown hairs from his chin.  In the days that followed, patient began to develop multiple pimples over his chin that he frequently attempted to pop unsuccessfully.  Over the next several days patient began to develop pain of the chin, burning to stinging in quality, progressively worsening until it became severe in intensity.  Pain was worse with attempting to open his mouth or ingest food.  Pain radiated down the neck.  As the patient symptoms continue to worsen over the next several days patient also began to develop generalized weakness and poor oral intake, primarily due to having difficulty opening his mouth and chewing food.  Patient eventually presented to [facility] emergency department for evaluation.  Upon evaluation in the emergency department patient was clinically found  to have significant cellulitis of the chin neck and face with areas of spontaneous purulent drainage.  CT imaging of the soft tissues of the neck was performed revealing a left facial cellulitis with subcutaneous purulent collection and reactive cervical lymphadenopathy.  These images were reviewed with Dr. Annalee Genta with ENT who felt that surgical intervention was not necessary and recommended hospitalization with intravenous antibiotics and to formally consult ENT later in the hospitalization if patient clinically worsened.  Intravenous vancomycin was initiated.  The hospitalist group was then called to assess the patient for admission the hospital.   Hospital Course:  1 facial cellulitis and abscess of the chin Patient presented with worsening symptoms from an area of ingrown hairs which patient had tried to manipulate.  Patient now noted to have abscess with soft tissue swelling.  Patient noted to have induration on chin area.   ENT, Dr. Annalee Genta was consulted.  Patient subsequently underwent incision and drainage of facial abscess per Dr. Annalee Genta, ENT on 10/23/2019 without any complications.  Patient placed empirically on IV vancomycin on presentation.  Patient improved clinically. Spontaneous purulence noted on admission was cultured with results positive for MRSA.  Surgical PCR screen MRSA was positive.  Blood cultures also obtained with no growth to date x4 days.  Patient remained afebrile.  Patient was followed by ENT and JP removed as patient noted to have minimal discharge.  Patient was discharged home on 7 days of oral doxycycline in addition to Bactroban ointment superficially and dressing as needed for any discharge per ENT recommendations.  Patient will follow up with ENT 2 weeks post discharge.   2.  Nicotine dependence Tobacco cessation stressed to patient.  Patient placed on a nicotine patch.  Outpatient follow-up.    3.  Lactic acidosis Likely secondary to problem #1,  improved  with hydration.  Patient also placed empirically on IV antibiotics.   4.  Dehydration Improved with hydration.    Patient placed on a diet which he tolerated.  Outpatient follow-up.   Procedures:  CT soft tissue neck 10/22/2019  Incision and drainage of facial abscess per Dr. Annalee GentaShoemaker, ENT 10/23/2019  Consultations:  ENT: Dr. Annalee GentaShoemaker 10/22/2019  Discharge Exam: Vitals:   10/24/19 2055 10/25/19 0640  BP: 123/79 118/84  Pulse: 87 74  Resp: 16 16  Temp: 98.5 F (36.9 C) 98.6 F (37 C)  SpO2: 98% 98%    General: NAD Cardiovascular: RRR Respiratory: CTAB  Discharge Instructions   Discharge Instructions    Diet general   Complete by: As directed    Discharge wound care:   Complete by: As directed    As per in hospital regimen   Increase activity slowly   Complete by: As directed      Allergies as of 10/25/2019   No Known Allergies     Medication List    TAKE these medications   doxycycline 100 MG tablet Commonly known as: ADOXA Take 1 tablet (100 mg total) by mouth 2 (two) times daily for 7 days.   mupirocin ointment 2 % Commonly known as: BACTROBAN Apply topically 2 (two) times daily.   nicotine 21 mg/24hr patch Commonly known as: NICODERM CQ - dosed in mg/24 hours Place 1 patch (21 mg total) onto the skin daily. Start taking on: October 26, 2019   oxyCODONE-acetaminophen 5-325 MG tablet Commonly known as: PERCOCET/ROXICET Take 2 tablets by mouth every 4 (four) hours as needed for moderate pain.   senna-docusate 8.6-50 MG tablet Commonly known as: Senokot-S Take 1 tablet by mouth 2 (two) times daily.            Discharge Care Instructions  (From admission, onward)         Start     Ordered   10/25/19 0000  Discharge wound care:       Comments: As per in hospital regimen   10/25/19 1425         No Known Allergies  Follow-up Information    Patrice ParadiseBracken, Lisa E, LCSW Follow up.   Specialty: Licensed Clinical Social Worker Why:  Please  follow up in regards to tobacco use  1-800-QUIT-NOW [phone])       [facility]. Go to.   Why: November 17, 2019 at 1030 am  Contact information: [address] [place] [phone]       Osborn CohoShoemaker, David, MD. Schedule an appointment as soon as possible for a visit in 2 week(s).   Specialty: Otolaryngology Contact information: [address] [place] [id number] [phone]                The results of significant diagnostics from this hospitalization (including imaging, microbiology, ancillary and laboratory) are listed below for reference.    Significant Diagnostic Studies: CT Soft Tissue Neck W Contrast  Result Date: 10/22/2019 CLINICAL DATA:  Facial cellulitis EXAM: CT NECK WITH CONTRAST TECHNIQUE: Multidetector CT imaging of the neck was performed using the standard protocol following the bolus administration of intravenous contrast. CONTRAST:  75mL OMNIPAQUE IOHEXOL 300 MG/ML  SOLN COMPARISON:  None. FINDINGS: PHARYNX AND LARYNX: The nasopharynx, oropharynx  and larynx are normal. Visible portions of the oral cavity, tongue base and floor of mouth are normal. Normal epiglottis, vallecula and pyriform sinuses. The larynx is normal. No retropharyngeal abscess, effusion or lymphadenopathy. SALIVARY GLANDS: Mildly enlarged left submandibular gland, likely reactive. Other salivary glands are normal. THYROID: Normal. LYMPH NODES: Bilateral level 1A and 1B reactive cervical lymph nodes. VASCULAR: Major cervical vessels are patent. LIMITED INTRACRANIAL: Normal. VISUALIZED ORBITS: Normal. MASTOIDS AND VISUALIZED PARANASAL SINUSES: No fluid levels or advanced mucosal thickening. No mastoid effusion. SKELETON: No bony spinal canal stenosis. No lytic or blastic lesions. UPPER CHEST: Clear. OTHER: There is heterogeneous induration of the subcutaneous tissues anterior to the left mandibular body. There is a  small amount of internal intermediate density material, likely purulence. There is thickening of the platysma muscle. IMPRESSION: 1. Left facial cellulitis with subcutaneous purulent collection. 2. Reactive cervical lymphadenopathy. Electronically Signed   By: Deatra Robinson M.D.   On: 10/22/2019 01:08    Microbiology: Recent Results (from the past 240 hour(s))  Culture, blood (Routine x 2)     Status: None (Preliminary result)   Collection Time: 10/21/19  7:54 PM   Specimen: BLOOD  Result Value Ref Range Status   Specimen Description BLOOD LEFT ARM  Final   Special Requests   Final    BOTTLES DRAWN AEROBIC AND ANAEROBIC Blood Culture adequate volume   Culture   Final    NO GROWTH 4 DAYS Performed at [facility], [address], [place], [place] [address]    Report Status PENDING  Incomplete  Culture, blood (Routine x 2)     Status: None (Preliminary result)   Collection Time: 10/21/19  7:54 PM   Specimen: BLOOD LEFT HAND  Result Value Ref Range Status   Specimen Description BLOOD LEFT HAND  Final   Special Requests   Final    BOTTLES DRAWN AEROBIC ONLY Blood Culture adequate volume   Culture   Final    NO GROWTH 4 DAYS Performed at [facility], [address] [place], [place] [address]    Report Status PENDING  Incomplete  SARS Coronavirus 2 by RT PCR (hospital order, performed in [facility] hospital lab) Nasopharyngeal Nasopharyngeal Swab     Status: None   Collection Time: 10/22/19 12:57 AM   Specimen: Nasopharyngeal Swab  Result Value Ref Range Status   SARS Coronavirus 2 NEGATIVE NEGATIVE Final    Comment: (NOTE) SARS-CoV-2 target nucleic acids are NOT DETECTED.  The SARS-CoV-2 RNA is generally detectable in upper and lower respiratory specimens during the acute phase of infection. The lowest concentration of SARS-CoV-2 viral copies this assay can detect is 250 copies / mL. A negative result does not preclude SARS-CoV-2 infection and should not be used as  the sole basis for treatment or other patient management decisions.  A negative result may occur with improper specimen collection / handling, submission of specimen other than nasopharyngeal swab, presence of viral mutation(s) within the areas targeted by this assay, and inadequate number of viral copies (<250 copies / mL). A negative result must be combined with clinical observations, patient history, and epidemiological information.  Fact Sheet for Patients:   BoilerBrush.com.cy  Fact Sheet for Healthcare Providers: https://pope.com/  This test is not yet approved or  cleared by the [place] FDA and has been authorized for detection and/or diagnosis of SARS-CoV-2 by FDA under an Emergency Use Authorization (EUA).  This EUA will remain in effect (meaning this test can be  used) for the duration of the COVID-19 declaration under Section 564(b)(1) of the Act, 21 U.S.C. section 360bbb-3(b)(1), unless the authorization is terminated or revoked sooner.  Performed at [facility], [place] [address], [place], [place] [address]   Aerobic Culture (superficial specimen)     Status: None   Collection Time: 10/22/19  3:03 AM   Specimen: Wound  Result Value Ref Range Status   Specimen Description WOUND FACE  Final   Special Requests CHIN  Final   Gram Stain   Final    RARE WBC PRESENT, PREDOMINANTLY PMN NO ORGANISMS SEEN Performed at [facility], [place] [address], [place], [place] [address]    Culture FEW METHICILLIN RESISTANT STAPHYLOCOCCUS AUREUS  Final   Report Status 10/24/2019 FINAL  Final   Organism ID, Bacteria METHICILLIN RESISTANT STAPHYLOCOCCUS AUREUS  Final      Susceptibility   Methicillin resistant staphylococcus aureus - MIC*    CIPROFLOXACIN >=8 RESISTANT Resistant     ERYTHROMYCIN >=8 RESISTANT Resistant     GENTAMICIN <=0.5 SENSITIVE Sensitive     OXACILLIN >=4 RESISTANT Resistant     TETRACYCLINE <=1  SENSITIVE Sensitive     VANCOMYCIN <=0.5 SENSITIVE Sensitive     TRIMETH/SULFA <=10 SENSITIVE Sensitive     CLINDAMYCIN >=8 RESISTANT Resistant     RIFAMPIN <=0.5 SENSITIVE Sensitive     Inducible Clindamycin NEGATIVE Sensitive     * FEW METHICILLIN RESISTANT STAPHYLOCOCCUS AUREUS  MRSA PCR Screening     Status: Abnormal   Collection Time: 10/22/19  5:15 AM   Specimen: Nasal Mucosa; Nasopharyngeal  Result Value Ref Range Status   MRSA by PCR POSITIVE (A) NEGATIVE Final    Comment:        The GeneXpert MRSA Assay (FDA approved for NASAL specimens only), is one component of a comprehensive MRSA colonization surveillance program. It is not intended to diagnose MRSA infection nor to guide or monitor treatment for MRSA infections. RESULT CALLED TO, READ BACK BY AND VERIFIED WITH: ABIERA,L RN 10/22/2019 AT 9983 SKEEN,P Performed at [facility], [place] [address], [place], [place] [address]   Surgical pcr screen     Status: Abnormal   Collection Time: 10/22/19  4:38 PM   Specimen: Nasal Mucosa; Nasal Swab  Result Value Ref Range Status   MRSA, PCR POSITIVE (A) NEGATIVE Final    Comment: RESULT CALLED TO, READ BACK BY AND VERIFIED WITH: K HARTGROVE RN 2026 10/22/19 A BROWNING    Staphylococcus aureus POSITIVE (A) NEGATIVE Final    Comment: (NOTE) The Xpert SA Assay (FDA approved for NASAL specimens in patients 73 years of age and older), is one component of a comprehensive surveillance program. It is not intended to diagnose infection nor to guide or monitor treatment. Performed at [facility], [place] [address], [place],  [address]      Labs: Basic Metabolic Panel: Recent Labs  Lab 10/21/19 1954 10/22/19 0956 10/23/19 1856 10/24/19 0309 10/25/19 0448  NA 133* 138 138 138 139  K 3.6 4.0 4.4 4.0 4.1  CL 98 101 100 101 103  CO2 24 27 27 26 24   GLUCOSE 248* 128* 211* 113* 97  BUN <5* 5* 8 11 12   CREATININE 0.92 0.92 0.89 0.81 0.82  CALCIUM 8.9 8.8* 8.9  8.6* 8.7*  MG  --  2.0  --   --   --    Liver Function Tests: Recent Labs  Lab 10/21/19 1954 10/23/19 1856  AST  36 24  ALT 106* 63*  ALKPHOS 116 115  BILITOT 1.6* 0.9  PROT 6.9 6.7  ALBUMIN 3.8 3.2*   No results for input(s): LIPASE, AMYLASE in the last 168 hours. No results for input(s): AMMONIA in the last 168 hours. CBC: Recent Labs  Lab 10/21/19 1954 10/22/19 0956 10/23/19 1856 10/24/19 0309 10/25/19 0448  WBC 9.8 8.6 6.8 8.0 5.5  NEUTROABS 8.3* 6.5 6.3 5.7 2.8  HGB 17.1* 17.0 16.9 16.1 15.9  HCT 49.1 48.5 47.7 46.1 46.2  MCV 90.9 90.3 90.2 90.2 91.5  PLT 179 177 229 261 210   Cardiac Enzymes: No results for input(s): CKTOTAL, CKMB, CKMBINDEX, TROPONINI in the last 168 hours. BNP: BNP (last 3 results) No results for input(s): BNP in the last 8760 hours.  ProBNP (last 3 results) No results for input(s): PROBNP in the last 8760 hours.  CBG: No results for input(s): GLUCAP in the last 168 hours.     Signed:  Ramiro Harvest MD.  [facility] 10/25/2019, 2:26 PM

## 2019-10-25 NOTE — Care Management (Signed)
[facility] , will assist with scripts. [facility] closed until 1:30pm will call for appointment after that.  Ronny Flurry

## 2019-10-25 NOTE — Progress Notes (Signed)
   ENT Progress Note: POD #2 s/p Procedure(s): I&D Facial abscess   Subjective: Decreased pain, tolerating normal oral diet  Objective: Vital signs in last 24 hours: Temp:  [98.3 F (36.8 C)-98.6 F (37 C)] 98.6 F (37 C) (06/14 0640) Pulse Rate:  [74-87] 74 (06/14 0640) Resp:  [16-18] 16 (06/14 0640) BP: (118-124)/(79-85) 118/84 (06/14 0640) SpO2:  [97 %-98 %] 98 % (06/14 0640) Weight change:  Last BM Date: 10/24/19  Intake/Output from previous day: 06/13 0701 - 06/14 0700 In: -  Out: 1100 [Urine:1100] Intake/Output this shift: No intake/output data recorded.  Labs: Recent Labs    10/24/19 0309 10/25/19 0448  WBC 8.0 5.5  HGB 16.1 15.9  HCT 46.1 46.2  PLT 261 210   Recent Labs    10/24/19 0309 10/25/19 0448  NA 138 139  K 4.0 4.1  CL 101 103  CO2 26 24  GLUCOSE 113* 97  BUN 11 12  CALCIUM 8.6* 8.7*    Studies/Results: No results found.   PHYSICAL EXAM: Significant improvement in erythema and swelling.  Minimal discharge, JP drain removed.   Assessment/Plan: Patient has had significant improvement over the last 48 hours.  Recommend continued Bactroban ointment superficially and dressing as needed for any discharge.  Discharge to home per medical service with oral antibiotics.  Monitor for any symptoms of recurrent or worsening infection.  Plan follow-up at [facility] in approximately 2 weeks for recheck.    Osborn Coho 10/25/2019, 8:00 AM

## 2019-10-25 NOTE — Progress Notes (Signed)
IV vanc infusing presently (IV site dislodged, had to obtain new site, then Vanc spilled on floor so had to get a new bag). Pt making some calls but unable to get ride home. Obtaining bus passes from CSW.

## 2019-10-25 NOTE — TOC Initial Note (Signed)
Transition of Care [facility]) - Initial/Assessment Note    Patient Details  Name: Brett Wu MRN: [id number] Date of Birth: 10-20-1995  [facility] CM/SW Contact:    Kingsley Plan, RN Phone Number: 10/25/2019, 2:04 PM  Clinical Narrative:                 Spoke to patient at bedside, from home alone. Has no insurance. Scheduled follow up appointment at [facility] Wellness information on AVS. Patient aware. Will assist with medications covered by [facility]. Patient does not have $3 co pay will over ride co pay. Patient stated he will start calling friends for transportation home.   Expected Discharge Plan: Home/Self Care Barriers to Discharge: Continued Medical Work up   Patient Goals and CMS Choice Patient states their goals for this hospitalization and ongoing recovery are:: to return to home CMS Medicare.gov Compare Post Acute Care list provided to:: Patient Choice offered to / list presented to : NA  Expected Discharge Plan and Services Expected Discharge Plan: Home/Self Care In-house Referral: Financial Counselor Discharge Planning Services: [facility]ATCH Program, Medication Assistance, Indigent Health Clinic   Living arrangements for the past 2 months: Single Family Home                 DME Arranged: N/A         HH Arranged: NA          Prior Living Arrangements/Services Living arrangements for the past 2 months: Single Family Home Lives with:: Self Patient language and need for interpreter reviewed:: Yes Do you feel safe going back to the place where you live?: Yes      Need for Family Participation in Patient Care: Yes (Comment) Care giver support system in place?: Yes (comment)   Criminal Activity/Legal Involvement Pertinent to Current Situation/Hospitalization: No - Comment as needed  Activities of Daily Living      Permission Sought/Granted   Permission granted to share information with : No              Emotional  Assessment Appearance:: Appears stated age   Affect (typically observed): Accepting Orientation: : Oriented to Self, Oriented to Place, Oriented to  Time, Oriented to Situation      Admission diagnosis:  Cellulitis of face [L03.211] Patient Active Problem List   Diagnosis Date Noted  . Dehydration   . Cellulitis of face 10/22/2019  . Abscess of chin 10/22/2019  . Nicotine dependence, cigarettes, uncomplicated 10/22/2019  . Lactic acidosis 10/22/2019  . Cannabis abuse with other cannabis-induced disorder (HCC) 09/27/2017  . Aggressive behavior    PCP:  Patient, No Pcp Per Pharmacy:   [facility] [address] [place] - [address] [place] [address] Phone: [phone] Fax: [phone]  [facility] [place], [place] - [address] [place] [address] Phone: [phone] Fax: [phone]     Social Determinants of Health (SDOH) Interventions    Readmission Risk Interventions No flowsheet data found.

## 2019-10-26 LAB — CULTURE, BLOOD (ROUTINE X 2)
Culture: NO GROWTH
Culture: NO GROWTH
Special Requests: ADEQUATE
Special Requests: ADEQUATE

## 2019-10-26 MED ORDER — DOXYCYCLINE HYCLATE 100 MG PO TABS
100.0000 mg | ORAL_TABLET | Freq: Two times a day (BID) | ORAL | Status: DC
  Administered 2019-10-26: 100 mg via ORAL
  Filled 2019-10-26: qty 1

## 2019-10-26 NOTE — Progress Notes (Signed)
PROGRESS NOTE    Brett Wu  WNU:[id number] DOB: April 29, 1996 DOA: 10/21/2019 PCP: Patient, No Pcp Per    Chief Complaint  Patient presents with  . Abscess    Brief Narrative:  HPI per Dr. Leafy Half 24 year old male with past medical history of nicotine dependence who presents to [facility] emergency department with complaints of facial pain.  Patient explains that 4 days ago he began to try and pick ingrown hairs from his chin.  In the days that followed, patient began to develop multiple pimples over his chin that he frequently attempted to pop unsuccessfully.  Over the next several days patient began to develop pain of the chin, burning to stinging in quality, progressively worsening until it became severe in intensity.  Pain is worse with attempting to open his mouth or ingest food.  Pain radiates down the neck.  As the patient symptoms continue to worsen over the next several days patient also began to develop generalized weakness and poor oral intake, primarily due to having difficulty opening his mouth and chewing food.  Patient eventually presented to [facility] emergency department for evaluation.  Upon evaluation in the emergency department patient was clinically found to have significant cellulitis of the chin neck and face with areas of spontaneous purulent drainage.  CT imaging of the soft tissues of the neck was performed revealing a left facial cellulitis with subcutaneous purulent collection and reactive cervical lymphadenopathy.  These images were reviewed with Dr. Annalee Genta with ENT who felt that surgical intervention was not necessary and recommended hospitalization with intravenous antibiotics and to formally consult ENT later in the hospitalization if patient clinically worsened.  Intravenous vancomycin was initiated.  The hospitalist group was then called to assess the patient for admission the hospital.  Assessment & Plan:   Principal Problem:    Cellulitis of face Active Problems:   Abscess of chin   Nicotine dependence, cigarettes, uncomplicated   Lactic acidosis   Dehydration  1 facial cellulitis and abscess of the chin Patient presented with worsening symptoms from an area of ingrown hairs which patient had tried to manipulate.  Patient now noted to have abscess with soft tissue swelling.  Patient noted to have induration on chin area.  Improving clinically post I and D.  ENT consulted and patient underwent I and D of chin abscess (10/23/2019) with purulent discharge noted.  Spontaneous purulence noted on admission was cultured with results positive for MRSA.  Surgical PCR screen MRSA was positive.  Blood cultures pending with no growth to date. Patient maintained on IV vancomycin.  Pain management.  Supportive care.  Patient will be discharged on doxycycline to complete a 10-day course of treatment.  Outpatient follow-up with ENT.   2.  Nicotine dependence Continue nicotine patch.  Tobacco cessation stressed to patient.  3.  Lactic acidosis Likely secondary to problem #1, dehydration.  Improved.  4.  Dehydration Resolved with hydration.    DVT prophylaxis: Lovenox Code Status: Full Family Communication: Updated patient.  No family at bedside. Disposition:   Status is: Inpatient    Dispo: The patient is from: Home              Anticipated d/c is to: Home              Anticipated d/c date is: Was to be discharged on 10/25/2019 however patient could not get a ride and as such she never left.  Hopefully patient to be discharged today.  Patient was to be discharged on 10/25/2019 however patient could not get a ride and as such stayed.  Patient medically stable hopefully will be discharged today.       Consultants:   ENT: Dr. Annalee Genta 10/22/2019  Procedures:  CT soft tissue neck 10/22/2019  Incision and drainage of facial abscess per Dr. Annalee Genta, ENT 10/23/2019  Antimicrobials:   IV vancomycin  10/21/2019>>>>>>> 10/25/2019  Oral doxycycline 10/26/2019   Subjective: Patient sitting up in bed.  Patient was to be discharged yesterday however stated could not get a ride.   Objective: Vitals:   10/25/19 0640 10/25/19 1824 10/25/19 2129 10/26/19 0616  BP: 118/84 122/80 119/73 (!) 132/93  Pulse: 74 87 81 76  Resp: 16 18 20 16   Temp: 98.6 F (37 C) 98.9 F (37.2 C) 98.2 F (36.8 C) (!) 97.4 F (36.3 C)  TempSrc: Oral Oral Oral Oral  SpO2: 98% 97% 98% 98%  Weight:      Height:       No intake or output data in the 24 hours ending 10/26/19 1242 Filed Weights   10/21/19 2200  Weight: 81.6 kg    Examination:  General exam: Alert.  Decreasing erythema and swelling on the chin.  Penrose in place. Respiratory system: CTA B.  No wheezes, no crackles, no rhonchi.  Normal respiratory effort. Cardiovascular system: RRR no murmurs rubs or gallops.  No JVD.  No lower extremity edema.  Gastrointestinal system: Abdomen is nontender, nondistended, soft, positive bowel sounds.  No rebound.  No guarding.  Central nervous system: Alert and oriented. No focal neurological deficits. Extremities: Symmetric 5 x 5 power. Skin: No rashes, lesions or ulcers Psychiatry: Judgement and insight appear normal. Mood & affect appropriate.     Data Reviewed: I have personally reviewed following labs and imaging studies  CBC: Recent Labs  Lab 10/21/19 1954 10/22/19 0956 10/23/19 1856 10/24/19 0309 10/25/19 0448  WBC 9.8 8.6 6.8 8.0 5.5  NEUTROABS 8.3* 6.5 6.3 5.7 2.8  HGB 17.1* 17.0 16.9 16.1 15.9  HCT 49.1 48.5 47.7 46.1 46.2  MCV 90.9 90.3 90.2 90.2 91.5  PLT 179 177 229 261 210    Basic Metabolic Panel: Recent Labs  Lab 10/21/19 1954 10/22/19 0956 10/23/19 1856 10/24/19 0309 10/25/19 0448  NA 133* 138 138 138 139  K 3.6 4.0 4.4 4.0 4.1  CL 98 101 100 101 103  CO2 24 27 27 26 24   GLUCOSE 248* 128* 211* 113* 97  BUN <5* 5* 8 11 12   CREATININE 0.92 0.92 0.89 0.81 0.82  CALCIUM  8.9 8.8* 8.9 8.6* 8.7*  MG  --  2.0  --   --   --     GFR: Estimated Creatinine Clearance: 136.7 mL/min (by C-G formula based on SCr of 0.82 mg/dL).  Liver Function Tests: Recent Labs  Lab 10/21/19 1954 10/23/19 1856  AST 36 24  ALT 106* 63*  ALKPHOS 116 115  BILITOT 1.6* 0.9  PROT 6.9 6.7  ALBUMIN 3.8 3.2*    CBG: No results for input(s): GLUCAP in the last 168 hours.   Recent Results (from the past 240 hour(s))  Culture, blood (Routine x 2)     Status: None   Collection Time: 10/21/19  7:54 PM   Specimen: BLOOD  Result Value Ref Range Status   Specimen Description BLOOD LEFT ARM  Final   Special Requests   Final    BOTTLES DRAWN AEROBIC AND ANAEROBIC Blood Culture adequate volume   Culture  Final    NO GROWTH 5 DAYS Performed at [facility], [address], [place], [place] [address]    Report Status 10/26/2019 FINAL  Final  Culture, blood (Routine x 2)     Status: None   Collection Time: 10/21/19  7:54 PM   Specimen: BLOOD LEFT HAND  Result Value Ref Range Status   Specimen Description BLOOD LEFT HAND  Final   Special Requests   Final    BOTTLES DRAWN AEROBIC ONLY Blood Culture adequate volume   Culture   Final    NO GROWTH 5 DAYS Performed at [facility], [address] [place], [place] [address]    Report Status 10/26/2019 FINAL  Final  SARS Coronavirus 2 by RT PCR (hospital order, performed in [facility] hospital lab) Nasopharyngeal Nasopharyngeal Swab     Status: None   Collection Time: 10/22/19 12:57 AM   Specimen: Nasopharyngeal Swab  Result Value Ref Range Status   SARS Coronavirus 2 NEGATIVE NEGATIVE Final    Comment: (NOTE) SARS-CoV-2 target nucleic acids are NOT DETECTED.  The SARS-CoV-2 RNA is generally detectable in upper and lower respiratory specimens during the acute phase of infection. The lowest concentration of SARS-CoV-2 viral copies this assay can detect is 250 copies / mL. A negative result does not preclude  SARS-CoV-2 infection and should not be used as the sole basis for treatment or other patient management decisions.  A negative result may occur with improper specimen collection / handling, submission of specimen other than nasopharyngeal swab, presence of viral mutation(s) within the areas targeted by this assay, and inadequate number of viral copies (<250 copies / mL). A negative result must be combined with clinical observations, patient history, and epidemiological information.  Fact Sheet for Patients:   BoilerBrush.com.cy  Fact Sheet for Healthcare Providers: https://pope.com/  This test is not yet approved or  cleared by the [place] FDA and has been authorized for detection and/or diagnosis of SARS-CoV-2 by FDA under an Emergency Use Authorization (EUA).  This EUA will remain in effect (meaning this test can be used) for the duration of the COVID-19 declaration under Section 564(b)(1) of the Act, 21 U.S.C. section 360bbb-3(b)(1), unless the authorization is terminated or revoked sooner.  Performed at [facility], [address] [place], [place] [address]   Aerobic Culture (superficial specimen)     Status: None   Collection Time: 10/22/19  3:03 AM   Specimen: Wound  Result Value Ref Range Status   Specimen Description WOUND FACE  Final   Special Requests CHIN  Final   Gram Stain   Final    RARE WBC PRESENT, PREDOMINANTLY PMN NO ORGANISMS SEEN Performed at [facility], [address], [place], [place] [address]    Culture FEW METHICILLIN RESISTANT STAPHYLOCOCCUS AUREUS  Final   Report Status 10/24/2019 FINAL  Final   Organism ID, Bacteria METHICILLIN RESISTANT STAPHYLOCOCCUS AUREUS  Final      Susceptibility   Methicillin resistant staphylococcus aureus - MIC*    CIPROFLOXACIN >=8 RESISTANT Resistant     ERYTHROMYCIN >=8 RESISTANT Resistant     GENTAMICIN <=0.5 SENSITIVE Sensitive     OXACILLIN >=4  RESISTANT Resistant     TETRACYCLINE <=1 SENSITIVE Sensitive     VANCOMYCIN <=0.5 SENSITIVE Sensitive     TRIMETH/SULFA <=10 SENSITIVE Sensitive     CLINDAMYCIN >=8 RESISTANT Resistant     RIFAMPIN <=0.5 SENSITIVE Sensitive     Inducible Clindamycin NEGATIVE Sensitive     *  FEW METHICILLIN RESISTANT STAPHYLOCOCCUS AUREUS  MRSA PCR Screening     Status: Abnormal   Collection Time: 10/22/19  5:15 AM   Specimen: Nasal Mucosa; Nasopharyngeal  Result Value Ref Range Status   MRSA by PCR POSITIVE (A) NEGATIVE Final    Comment:        The GeneXpert MRSA Assay (FDA approved for NASAL specimens only), is one component of a comprehensive MRSA colonization surveillance program. It is not intended to diagnose MRSA infection nor to guide or monitor treatment for MRSA infections. RESULT CALLED TO, READ BACK BY AND VERIFIED WITH: ABIERA,L RN 10/22/2019 AT 0347 SKEEN,P Performed at [facility], [place] [address], [place], [place] [address]   Surgical pcr screen     Status: Abnormal   Collection Time: 10/22/19  4:38 PM   Specimen: Nasal Mucosa; Nasal Swab  Result Value Ref Range Status   MRSA, PCR POSITIVE (A) NEGATIVE Final    Comment: RESULT CALLED TO, READ BACK BY AND VERIFIED WITH: K HARTGROVE RN 2026 10/22/19 A BROWNING    Staphylococcus aureus POSITIVE (A) NEGATIVE Final    Comment: (NOTE) The Xpert SA Assay (FDA approved for NASAL specimens in patients 31 years of age and older), is one component of a comprehensive surveillance program. It is not intended to diagnose infection nor to guide or monitor treatment. Performed at [facility], [place] [address], [place], [place] [address]          Radiology Studies: No results found.      Scheduled Meds: . albuterol  2.5 mg Nebulization Once  . doxycycline  100 mg Oral Q12H  . enoxaparin (LOVENOX) injection  40 mg Subcutaneous Q24H  . mupirocin ointment  1 application Nasal BID  . mupirocin ointment   Topical BID    . nicotine  21 mg Transdermal Daily  . senna-docusate  1 tablet Oral BID   Continuous Infusions: . vancomycin 1,500 mg (10/25/19 1431)     LOS: 4 days    Time spent: 35 minutes    Irine Seal, MD [facility]   To contact the attending provider between 7A-7P or the covering provider during after hours 7P-7A, please log into the web site [email or web address] and access using universal [place] password for that web site. If you do not have the password, please call the hospital operator.  10/26/2019, 12:42 PM

## 2019-10-26 NOTE — Care Management (Signed)
Spoke to patient at bedside. His grandfather is coming to pick him up. Patient yelling cursing asking for paperwork as soon as possible because his grandfather will not wait when he gets here. MD and nurse aware.   Patient going to [address] [place], [place] [address].  Will give nurse cab voucher in case needed.   Ronny Flurry RN

## 2019-11-17 ENCOUNTER — Inpatient Hospital Stay: Payer: Self-pay | Admitting: Physician Assistant

## 2021-08-09 IMAGING — CT CT NECK W/ CM
4 of 6 series · 12 of 33 positions shown, 14 images · IV contrast (APPLIED)
Comparison: None.

CLINICAL DATA: Facial cellulitis

EXAM:
CT NECK WITH CONTRAST
TECHNIQUE: Multidetector CT imaging of the neck was performed using the
standard protocol following the bolus administration of intravenous
contrast.
CONTRAST:  75mL OMNIPAQUE IOHEXOL 300 MG/ML  SOLN

[Series 4: neck lungs · axial · 0.61mm/px · z∈[+1119,+1219]mm · 2 of 151 slices shown]
[im 51/151  bone]
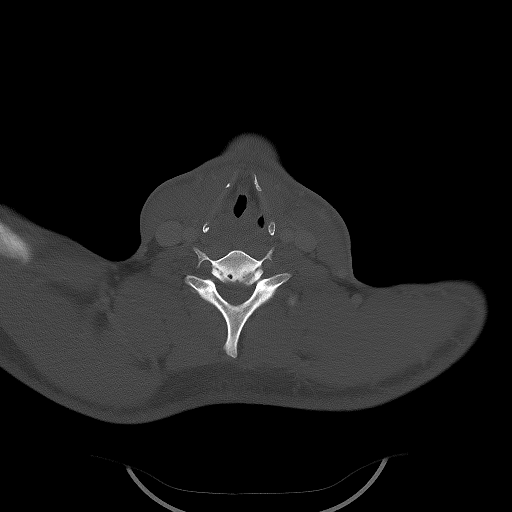
[im 101/151  bone]
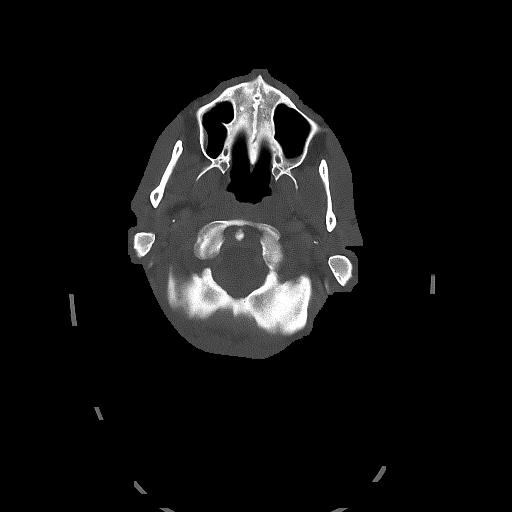

[Series 5: axial bone · axial · 0.61mm/px · z∈[+1119,+1219]mm · 2 of 151 slices shown, 3 images]
[im 51/151  soft-tissue]
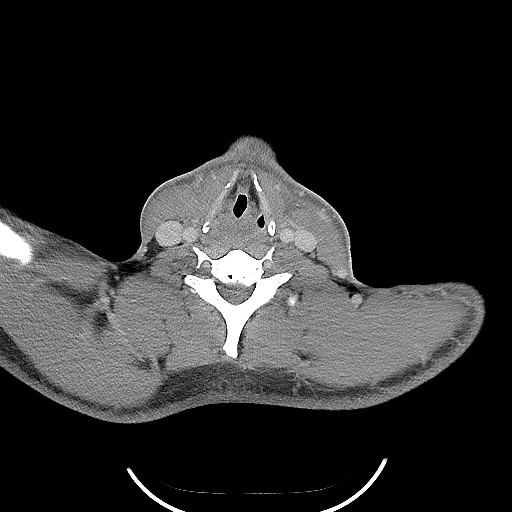
[im 51/151  bone]
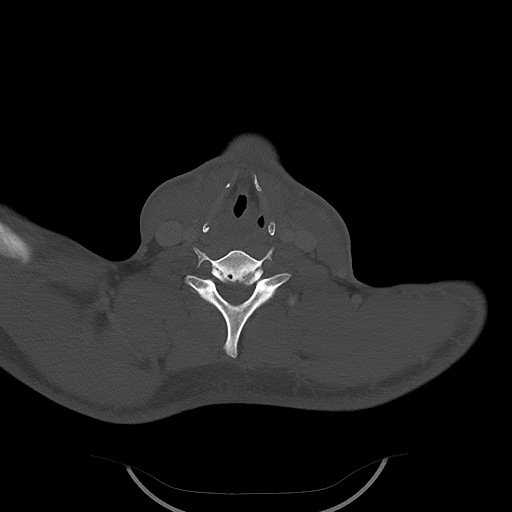
[im 101/151  bone]
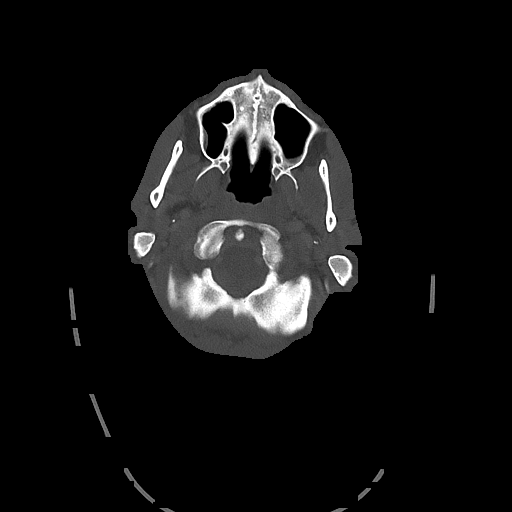

[Series 6: sag neck · sagittal · 0.58mm/px · 5 of 107 slices shown, 6 images]
[im 36/107  bone]
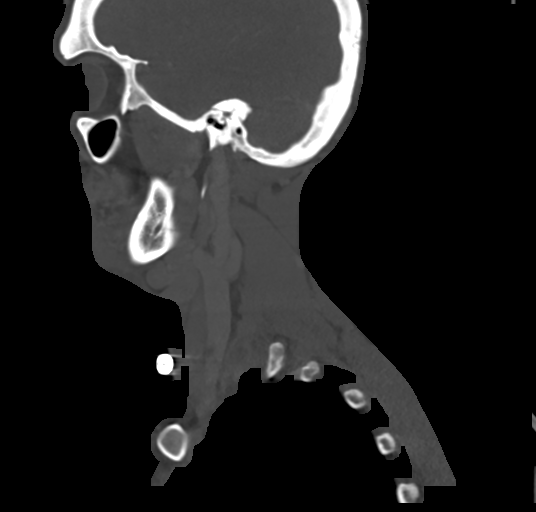
[im 45/107  bone]
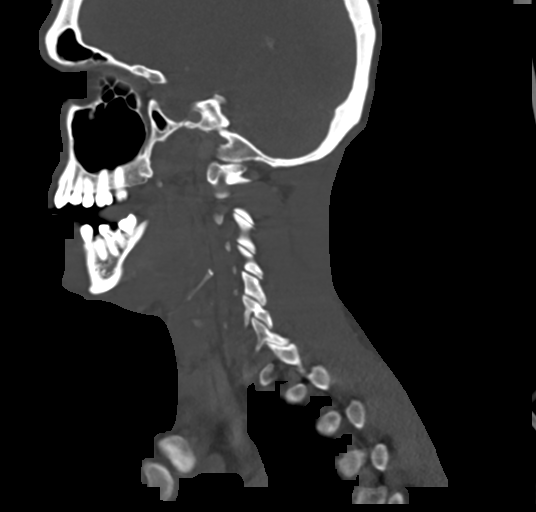
[im 54/107  soft-tissue]
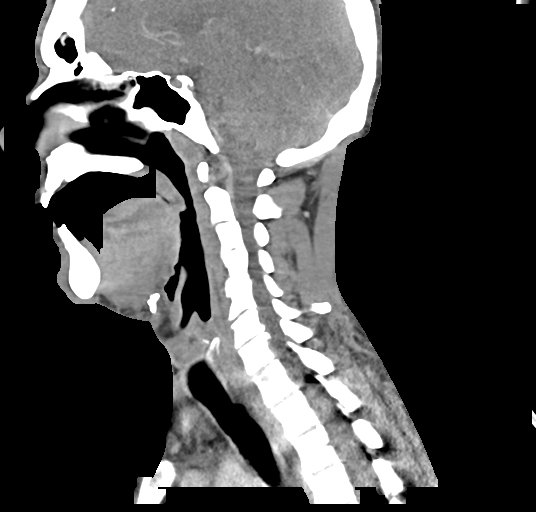
[im 54/107  bone]
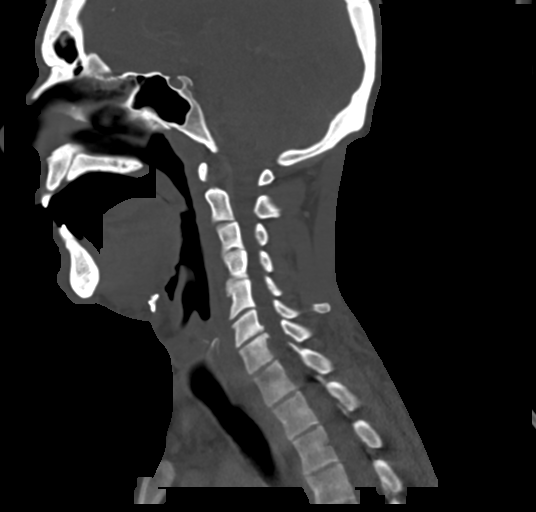
[im 62/107  bone]
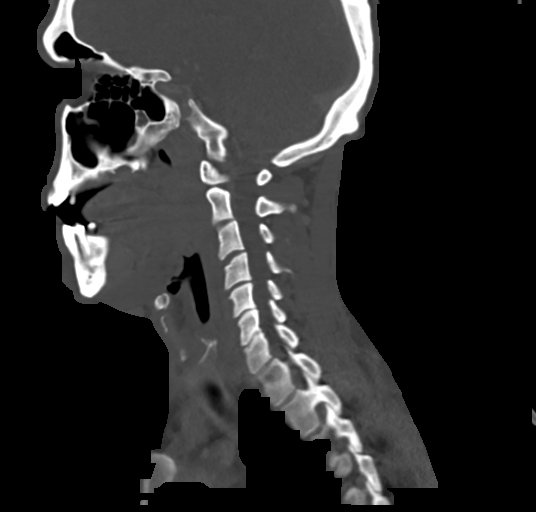
[im 71/107  bone]
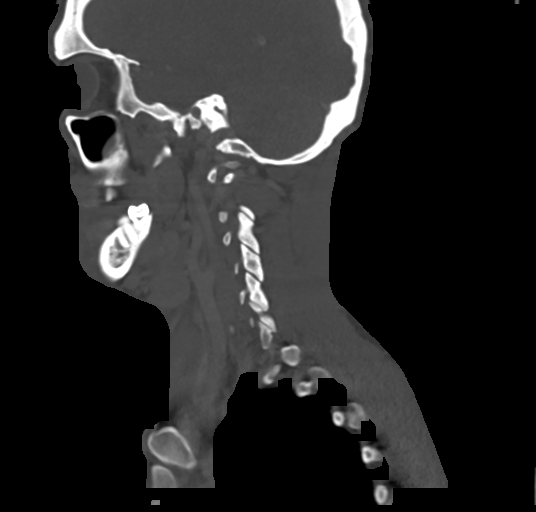

[Series 7: cor neck · coronal · 0.61mm/px · 3 of 157 slices shown]
[im 32/157  bone]
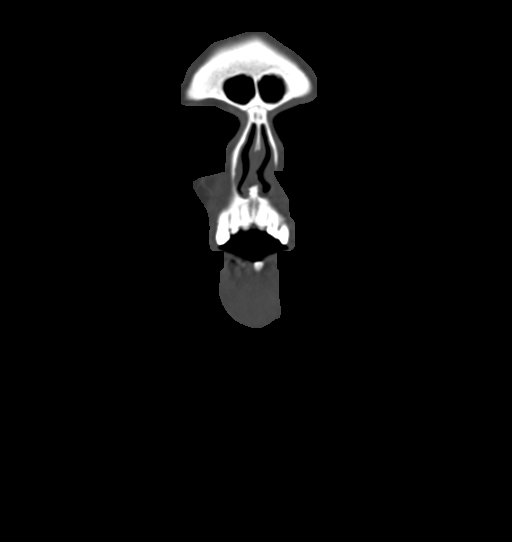
[im 63/157  bone]
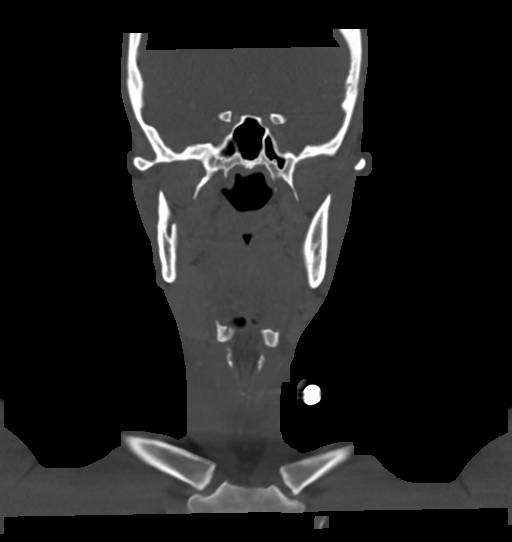
[im 94/157  bone]
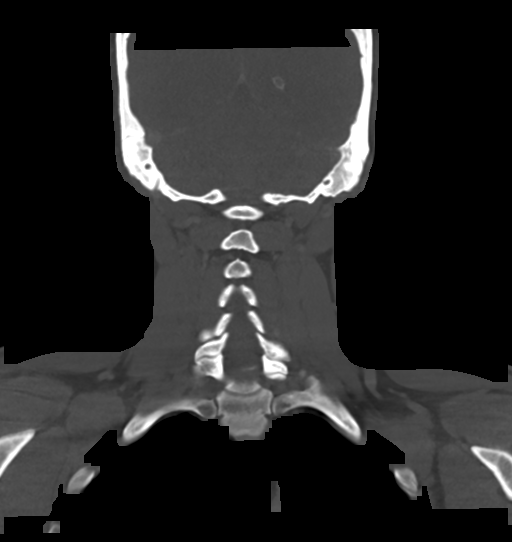

[12 of 33 positions shown; findings below may reference images not displayed]

FINDINGS: PHARYNX AND LARYNX: The nasopharynx, oropharynx and larynx are
normal. Visible portions of the oral cavity, tongue base and floor
of mouth are normal. Normal epiglottis, vallecula and pyriform
sinuses. The larynx is normal. No retropharyngeal abscess, effusion
or lymphadenopathy.

SALIVARY GLANDS: Mildly enlarged left submandibular gland, likely
reactive. Other salivary glands are normal.

THYROID: Normal.

LYMPH NODES: Bilateral level 1A and 1B reactive cervical lymph
nodes.

VASCULAR: Major cervical vessels are patent.

LIMITED INTRACRANIAL: Normal.

VISUALIZED ORBITS: Normal.

MASTOIDS AND VISUALIZED PARANASAL SINUSES: No fluid levels or
advanced mucosal thickening. No mastoid effusion.

SKELETON: No bony spinal canal stenosis. No lytic or blastic
lesions.

UPPER CHEST: Clear.

OTHER: There is heterogeneous induration of the subcutaneous tissues
anterior to the left mandibular body. There is a small amount of
internal intermediate density material, likely purulence. There is
thickening of the platysma muscle.
IMPRESSION: 1. Left facial cellulitis with subcutaneous purulent collection.
2. Reactive cervical lymphadenopathy.

## 2021-10-22 ENCOUNTER — Emergency Department (HOSPITAL_COMMUNITY)
Admission: EM | Admit: 2021-10-22 | Discharge: 2021-10-23 | Discharge status: Against Medical Advice | Payer: Self-pay | Attending: Emergency Medicine | Admitting: Emergency Medicine

## 2021-10-22 ENCOUNTER — Other Ambulatory Visit: Payer: Self-pay

## 2021-10-22 DIAGNOSIS — Z5321 Procedure and treatment not carried out due to patient leaving prior to being seen by health care provider: Secondary | ICD-10-CM | POA: Insufficient documentation

## 2021-10-22 DIAGNOSIS — L539 Erythematous condition, unspecified: Secondary | ICD-10-CM | POA: Insufficient documentation

## 2021-10-22 MED ORDER — ONDANSETRON 4 MG PO TBDP
4.0000 mg | ORAL_TABLET | Freq: Once | ORAL | Status: AC
  Administered 2021-10-22: 4 mg via ORAL
  Filled 2021-10-22: qty 1

## 2021-10-22 NOTE — ED Triage Notes (Signed)
Pt has small area of redness on his L inner leg near his ankle where he thinks a spider bit him last night. Pt saw a large spider at some point last night and then he walked outside today and the sun was really bright in his eyes and he felt a little weird so he wanted to come to the ED to get checked out. No pain to site of bite.

## 2021-10-23 NOTE — ED Notes (Signed)
Called pts name 3x for updated VS with no response.  

## 2024-05-07 ENCOUNTER — Inpatient Hospital Stay (HOSPITAL_COMMUNITY)
Admission: AD | Admit: 2024-05-07 | Discharge: 2024-05-29 | DRG: 885 | Disposition: A | Discharge status: Home / Self Care | Source: Intra-hospital

## 2024-05-07 ENCOUNTER — Encounter (HOSPITAL_COMMUNITY): Payer: Self-pay

## 2024-05-07 ENCOUNTER — Inpatient Hospital Stay (HOSPITAL_COMMUNITY)

## 2024-05-07 ENCOUNTER — Encounter (HOSPITAL_COMMUNITY): Payer: Self-pay | Admitting: Behavioral Health

## 2024-05-07 ENCOUNTER — Other Ambulatory Visit: Payer: Self-pay

## 2024-05-07 ENCOUNTER — Emergency Department (HOSPITAL_COMMUNITY)
Admission: EM | Admit: 2024-05-07 | Discharge: 2024-05-07 | Disposition: A | Discharge status: Other Acute Inpatient Hospital | Attending: Emergency Medicine | Admitting: Emergency Medicine

## 2024-05-07 DIAGNOSIS — F419 Anxiety disorder, unspecified: Secondary | ICD-10-CM | POA: Diagnosis present

## 2024-05-07 DIAGNOSIS — W19XXXA Unspecified fall, initial encounter: Secondary | ICD-10-CM | POA: Diagnosis present

## 2024-05-07 DIAGNOSIS — R443 Hallucinations, unspecified: Secondary | ICD-10-CM

## 2024-05-07 DIAGNOSIS — F940 Selective mutism: Secondary | ICD-10-CM | POA: Diagnosis present

## 2024-05-07 DIAGNOSIS — Z5941 Food insecurity: Secondary | ICD-10-CM | POA: Diagnosis not present

## 2024-05-07 DIAGNOSIS — F15959 Other stimulant use, unspecified with stimulant-induced psychotic disorder, unspecified: Secondary | ICD-10-CM | POA: Diagnosis not present

## 2024-05-07 DIAGNOSIS — F1510 Other stimulant abuse, uncomplicated: Secondary | ICD-10-CM | POA: Diagnosis not present

## 2024-05-07 DIAGNOSIS — F17293 Nicotine dependence, other tobacco product, with withdrawal: Secondary | ICD-10-CM | POA: Diagnosis present

## 2024-05-07 DIAGNOSIS — F17203 Nicotine dependence unspecified, with withdrawal: Secondary | ICD-10-CM | POA: Diagnosis not present

## 2024-05-07 DIAGNOSIS — F1520 Other stimulant dependence, uncomplicated: Secondary | ICD-10-CM | POA: Insufficient documentation

## 2024-05-07 DIAGNOSIS — F329 Major depressive disorder, single episode, unspecified: Secondary | ICD-10-CM

## 2024-05-07 DIAGNOSIS — Z23 Encounter for immunization: Secondary | ICD-10-CM

## 2024-05-07 DIAGNOSIS — G4700 Insomnia, unspecified: Secondary | ICD-10-CM | POA: Diagnosis present

## 2024-05-07 DIAGNOSIS — F209 Schizophrenia, unspecified: Secondary | ICD-10-CM | POA: Diagnosis present

## 2024-05-07 DIAGNOSIS — F1523 Other stimulant dependence with withdrawal: Secondary | ICD-10-CM | POA: Diagnosis present

## 2024-05-07 DIAGNOSIS — F251 Schizoaffective disorder, depressive type: Secondary | ICD-10-CM | POA: Diagnosis present

## 2024-05-07 DIAGNOSIS — S01111A Laceration without foreign body of right eyelid and periocular area, initial encounter: Secondary | ICD-10-CM | POA: Diagnosis present

## 2024-05-07 DIAGNOSIS — S01112A Laceration without foreign body of left eyelid and periocular area, initial encounter: Secondary | ICD-10-CM | POA: Diagnosis present

## 2024-05-07 DIAGNOSIS — Z79899 Other long term (current) drug therapy: Secondary | ICD-10-CM | POA: Diagnosis not present

## 2024-05-07 DIAGNOSIS — Z634 Disappearance and death of family member: Secondary | ICD-10-CM

## 2024-05-07 DIAGNOSIS — Z91148 Patient's other noncompliance with medication regimen for other reason: Secondary | ICD-10-CM

## 2024-05-07 DIAGNOSIS — R45851 Suicidal ideations: Secondary | ICD-10-CM | POA: Diagnosis present

## 2024-05-07 DIAGNOSIS — K5900 Constipation, unspecified: Secondary | ICD-10-CM | POA: Diagnosis present

## 2024-05-07 DIAGNOSIS — F29 Unspecified psychosis not due to a substance or known physiological condition: Secondary | ICD-10-CM | POA: Diagnosis not present

## 2024-05-07 DIAGNOSIS — F15259 Other stimulant dependence with stimulant-induced psychotic disorder, unspecified: Secondary | ICD-10-CM | POA: Diagnosis present

## 2024-05-07 DIAGNOSIS — F200 Paranoid schizophrenia: Secondary | ICD-10-CM | POA: Diagnosis present

## 2024-05-07 DIAGNOSIS — F17213 Nicotine dependence, cigarettes, with withdrawal: Secondary | ICD-10-CM | POA: Diagnosis present

## 2024-05-07 DIAGNOSIS — F19959 Other psychoactive substance use, unspecified with psychoactive substance-induced psychotic disorder, unspecified: Secondary | ICD-10-CM | POA: Diagnosis not present

## 2024-05-07 LAB — CBC WITH DIFFERENTIAL/PLATELET
Abs Immature Granulocytes: 0.03 K/uL (ref 0.00–0.07)
Basophils Absolute: 0.1 K/uL (ref 0.0–0.1)
Basophils Relative: 1 %
Eosinophils Absolute: 0.1 K/uL (ref 0.0–0.5)
Eosinophils Relative: 1 %
HCT: 47.1 % (ref 39.0–52.0)
Hemoglobin: 16.9 g/dL (ref 13.0–17.0)
Immature Granulocytes: 0 %
Lymphocytes Relative: 19 %
Lymphs Abs: 2.3 K/uL (ref 0.7–4.0)
MCH: 30.6 pg (ref 26.0–34.0)
MCHC: 35.9 g/dL (ref 30.0–36.0)
MCV: 85.2 fL (ref 80.0–100.0)
Monocytes Absolute: 1 K/uL (ref 0.1–1.0)
Monocytes Relative: 8 %
Neutro Abs: 8.5 K/uL — ABNORMAL HIGH (ref 1.7–7.7)
Neutrophils Relative %: 71 %
Platelets: 266 K/uL (ref 150–400)
RBC: 5.53 MIL/uL (ref 4.22–5.81)
RDW: 12.7 % (ref 11.5–15.5)
WBC: 12 K/uL — ABNORMAL HIGH (ref 4.0–10.5)
nRBC: 0 % (ref 0.0–0.2)

## 2024-05-07 LAB — ETHANOL: Alcohol, Ethyl (B): <15 mg/dL

## 2024-05-07 LAB — URINE DRUG SCREEN
Amphetamines: POSITIVE — AB
Barbiturates: NEGATIVE
Benzodiazepines: NEGATIVE
Cocaine: NEGATIVE
Fentanyl: NEGATIVE
Methadone Scn, Ur: NEGATIVE
Opiates: NEGATIVE
Tetrahydrocannabinol: NEGATIVE

## 2024-05-07 LAB — COMPREHENSIVE METABOLIC PANEL WITH GFR
ALT: 31 U/L (ref 0–44)
AST: 23 U/L (ref 15–41)
Albumin: 5 g/dL (ref 3.5–5.0)
Alkaline Phosphatase: 122 U/L (ref 38–126)
Anion gap: 12 (ref 5–15)
BUN: 12 mg/dL (ref 6–20)
CO2: 29 mmol/L (ref 22–32)
Calcium: 10.2 mg/dL (ref 8.9–10.3)
Chloride: 99 mmol/L (ref 98–111)
Creatinine, Ser: 0.69 mg/dL (ref 0.61–1.24)
GFR, Estimated: >60 mL/min
Glucose, Bld: 104 mg/dL — ABNORMAL HIGH (ref 70–99)
Potassium: 4.1 mmol/L (ref 3.5–5.1)
Sodium: 140 mmol/L (ref 135–145)
Total Bilirubin: 0.7 mg/dL (ref 0.0–1.2)
Total Protein: 7.6 g/dL (ref 6.5–8.1)

## 2024-05-07 LAB — HIV ANTIBODY (ROUTINE TESTING W REFLEX): HIV Screen 4th Generation wRfx: NONREACTIVE

## 2024-05-07 MED ORDER — LORAZEPAM 2 MG/ML IJ SOLN: 2.0000 mg | Freq: Three times a day (TID) | INTRAMUSCULAR | Status: DC | PRN

## 2024-05-07 MED ORDER — OLANZAPINE 10 MG PO TBDP
10.0000 mg | ORAL_TABLET | Freq: Every day | ORAL | Status: DC
  Administered 2024-05-07 – 2024-05-09 (×3): 10 mg via ORAL
  Filled 2024-05-07 (×3): qty 1

## 2024-05-07 MED ORDER — ACETAMINOPHEN 325 MG PO TABS
650.0000 mg | ORAL_TABLET | Freq: Four times a day (QID) | ORAL | Status: DC | PRN
  Administered 2024-05-12 – 2024-05-27 (×8): 650 mg via ORAL
  Filled 2024-05-07 (×3): qty 2

## 2024-05-07 MED ORDER — DIPHENHYDRAMINE HCL 25 MG PO CAPS: 50.0000 mg | ORAL_CAPSULE | Freq: Three times a day (TID) | ORAL | Status: DC | PRN

## 2024-05-07 MED ORDER — MAGNESIUM HYDROXIDE 400 MG/5ML PO SUSP: 30.0000 mL | Freq: Every day | ORAL | Status: DC | PRN

## 2024-05-07 MED ORDER — TRAZODONE HCL 50 MG PO TABS
50.0000 mg | ORAL_TABLET | Freq: Every evening | ORAL | Status: DC | PRN
  Administered 2024-05-10 – 2024-05-24 (×13): 50 mg via ORAL
  Filled 2024-05-07 (×10): qty 1

## 2024-05-07 MED ORDER — LIDOCAINE-EPINEPHRINE (PF) 2 %-1:200000 IJ SOLN
10.0000 mL | Freq: Once | INTRAMUSCULAR | Status: AC
  Administered 2024-05-07: 10 mL
  Filled 2024-05-07: qty 20

## 2024-05-07 MED ORDER — HALOPERIDOL LACTATE 5 MG/ML IJ SOLN: 5.0000 mg | Freq: Three times a day (TID) | INTRAMUSCULAR | Status: DC | PRN

## 2024-05-07 MED ORDER — DIPHENHYDRAMINE HCL 50 MG/ML IJ SOLN: 50.0000 mg | Freq: Three times a day (TID) | INTRAMUSCULAR | Status: DC | PRN

## 2024-05-07 MED ORDER — HALOPERIDOL 5 MG PO TABS: 5.0000 mg | ORAL_TABLET | Freq: Three times a day (TID) | ORAL | Status: DC | PRN

## 2024-05-07 MED ORDER — TETANUS-DIPHTH-ACELL PERTUSSIS 5-2-15.5 LF-MCG/0.5 IM SUSP
0.5000 mL | Freq: Once | INTRAMUSCULAR | Status: AC
  Administered 2024-05-07: 0.5 mL via INTRAMUSCULAR
  Filled 2024-05-07: qty 0.5

## 2024-05-07 MED ORDER — ALUM & MAG HYDROXIDE-SIMETH 200-200-20 MG/5ML PO SUSP: 30.0000 mL | ORAL | Status: DC | PRN

## 2024-05-07 MED ORDER — HALOPERIDOL LACTATE 5 MG/ML IJ SOLN: 10.0000 mg | Freq: Three times a day (TID) | INTRAMUSCULAR | Status: DC | PRN

## 2024-05-07 MED ORDER — HYDROXYZINE HCL 25 MG PO TABS
25.0000 mg | ORAL_TABLET | Freq: Three times a day (TID) | ORAL | Status: DC | PRN
  Administered 2024-05-12 – 2024-05-28 (×10): 25 mg via ORAL
  Filled 2024-05-07 (×9): qty 1

## 2024-05-07 NOTE — Progress Notes (Signed)
 Patient was escorted to lab room for evening blood draw. He reports having a fear of needles. MHT and Lab tech had him sit and when walking out he fell and hit the rolling blue book cart. Witnessed fall. Code blue was called and nurse assessed patient. Alert and oriented. Gash above right eye and cut over left eye. Towels given to contain blood flow.   1840 EMS called.  1850 Nurse called Provider and made him aware of what happened. Provider is aware that EMS was called and that patient may need to be sent out for further evaluation. 1905 Paperwork given to EMS and MHT went with patient during transport.

## 2024-05-07 NOTE — ED Notes (Signed)
 Pt has 2 pt belongings bags in the cabinet mark 23-[address]

## 2024-05-07 NOTE — ED Triage Notes (Signed)
 Pt presents to ED via [employer] with IVC paperwork.  Pt's family were worried about pt and states that he has been having hallucinations and does not want to live. Hx of schizophrenia. Per pt he has been seeing weird stuff lately but he has not been thinking about hurting himself.  Pt stated he is worried that he will not be forgiven.

## 2024-05-07 NOTE — Progress Notes (Signed)
 MHT brought pt back to the unit  @ 2150, no report called

## 2024-05-07 NOTE — ED Notes (Signed)
 Pt sent over from [facility]. Reportedly stood up, felt woozy and fell striking right side of head on dresser.  Lac noted to right eyebrow area. Bleeding controlled.  Pt not answering questions, just repeatedly asking for his phone

## 2024-05-07 NOTE — Plan of Care (Signed)
   Problem: Education: Goal: Emotional status will improve Outcome: Progressing Goal: Mental status will improve Outcome: Progressing   Problem: Safety: Goal: Periods of time without injury will increase Outcome: Progressing

## 2024-05-07 NOTE — ED Notes (Signed)
 Patient stated he didn't like needles and then Patient passed out twice while getting blood work

## 2024-05-07 NOTE — Consult Note (Addendum)
 [name] Psychiatric Consult Initial  Patient Name: .Brett Wu  MRN: [id number]  DOB: [birth year]  Consult Order details:  Orders (From admission, onward)     Start     Ordered   05/07/24 1143  CONSULT TO CALL ACT TEAM       Ordering Provider: Ellouise Richerd POUR, DO  Provider:  (Not yet assigned)  Question:  Reason for Consult?  Answer:  Psych consult   05/07/24 1143             Mode of Visit: In person    Psychiatry Consult Evaluation  Service Date: May 07, 2024 LOS:  LOS: 0 days  Chief Complaint IVC for increasing hallucinations, delusions and suicidal thoughts.  Primary Psychiatric Diagnoses  Schizophrenia  Methamphetamine use   Assessment  Brett Wu is a 28 y.o. male admitted: Presented to the ED on 05/07/2024  7:25 AM under IVC for increasing hallucinations, delusions and suicidal thoughts.   Per IVC, petitioned by [name] (Peer Support),  Respondent has a schizophrenia diagnosis, currently takes Topamax, Zyprexa  Zydis, Olanzapine  and Clonazepan. Respondent communicated to petitioner on today's date that he was not feeling well and does not want to live anymore. Petitioner says he has become more distant and cannot concentrate due to hallucinations and delusions. Respondent does use meth.    He carries the psychiatric diagnoses of schizophrenia and methamphetamine use and has no reported past medical history.  His current presentation of thought blocking, disorganized thought pattern and reports of hallucinating and delusions are most consistent with schizophrenia exacerbated by methamphetamine use. He meets criteria for inpatient psychiatric treatment based on acute psychotic features. Current outpatient psychotropic medications include Zyprexa  15 mg po QHS and historically he has had a positive response to these medications. He was non compliant with medications prior to admission as evidenced by stating that he's missed doses.   On initial  examination, patient presents with disorganized thought pattern and thought blocking. Patient is a poor historian and is unable to engage in psychiatric assessment at this time, limiting the ability to obtain accurate subjective information. Will obtain collateral information from [name], IVC petitioner.   Please see plan below for detailed recommendations.   Diagnoses:  Active Hospital problems: Active Problems:   Schizophrenia (HCC)   Methamphetamine abuse (HCC)    Plan   ## Psychiatric Medication Recommendations:  -Pending call back from [name], Nurse with [facility] to verify home psychotropic doses for medications.    Restart start Zyprexa  Zydis at 10 mg po at bedtime, home dose is 15 mg po at bedtime, patient has been noncompliant w/taking medications  ## Medical Decision Making Capacity: Not specifically addressed in this encounter  ## Further Work-up:  -- Add TSH, A1C and Lipid panel [facility] order set) --Add STD screening [facility] order set) due to high risk considering history of drug use  -- UDS pending collection  -- most recent EKG on 05/07/24 had QtC of 418 -- Pertinent labwork reviewed earlier this admission includes: CMP, CBC, BAL, and EKG    ## Disposition:-- We recommend inpatient psychiatric hospitalization after medical hospitalization. Patient has been involuntarily committed on 05/07/24.   ## Behavioral / Environmental: -Recommend using specific terminology regarding PNES, i.e. call the episodes non-epileptic seizures rather than pseudoseizures as the latter insinuates fake or feigned symptoms, when the events are a very real experience to the patient and are a physical, non-volitional, manifestation of fear, pain and anxiety.  or Utilize compassion and acknowledge  the patient's experiences while setting clear and realistic expectations for care.    ## Safety and Observation Level:  - Based on my clinical evaluation, I estimate the  patient to be at high risk of self harm in the current setting. - At this time, we recommend  1:1 Observation. This decision is based on my review of the chart including patient's history and current presentation, interview of the patient, mental status examination, and consideration of suicide risk including evaluating suicidal ideation, plan, intent, suicidal or self-harm behaviors, risk factors, and protective factors. This judgment is based on our ability to directly address suicide risk, implement suicide prevention strategies, and develop a safety plan while the patient is in the clinical setting. Please contact our team if there is a concern that risk level has changed.  CSSR Risk Category:C-SSRS RISK CATEGORY: No Risk  Suicide Risk Assessment: Patient has following modifiable risk factors for suicide: medication noncompliance and active mental illness (to encompass adhd, tbi, mania, psychosis, trauma reaction), which we are addressing by recommending inpatient psychiatric treatment. Patient has following non-modifiable or demographic risk factors for suicide: male gender Patient has the following protective factors against suicide: Access to outpatient mental health care  Thank you for this consult request. Recommendations have been communicated to the primary team.  We will continue to follow at this time.   [name], NP       History of Present Illness  Relevant Aspects of Hospital ED Course: Admitted on 05/07/2024 per Richerd Later, DO., EDP note, Patient is a 28 year old male with a past medical history of schizophrenia and methamphetamine use presenting to the emergency department under IVC. Patient is IVC need for family from having increasing hallucinations and delusions and reporting to them suicidal thoughts. The patient is not forthcoming with me and denies any suicidal ideation and describes his shoulders when asked if he is having any hallucinations. He denies any HI.  He does endorse using meth, denies any other drug or alcohol use.  Patient Report:  On initial examination, patient presents with disorganized thought pattern and thought blocking. Patient is a poor historian and is unable to engage in psychiatric assessment at this time, limiting the ability to obtain accurate subjective information. He is alert and oriented to self and place. He is disoriented to time and situation. He denies SI and HI. He does not respond when asked if he's experiencing hallucinations or paranoia. He reports using methamphetamine by smoking and sniffing and denies IV drug use. He reports using methamphetamine last, the other day.  He reports drinking alcohol, a little bit. He does not provide a detailed substance use history. He reports a history of schizophrenia and states that his medications is prescribed by Dr. Malinda. When asked if he has an ACT Team, he states yes but is unable to recall the name of the agency. He reports living with family but is unable to identify the family members he resides with. He states that life is moving fast and asked if he could go home with his family.   Update: 1450: Per Rosaline, Nurse at [facility], patient is only prescribed Zyprexa  zydis 15 mg po at bedtime. Unsure if he has been complaint with medications.    Psych ROS:  Depression: UTA Anxiety:   UTA Mania (lifetime and current):  UTA Psychosis: (lifetime and current): History of schizophrenia   Collateral information:  I spoke to [employer] via telephone who confirms that she is with [facility] as  a peer support specialist and that the patient is under the care of Dr. Malinda for medication management. She states that the patient called her around 2:30 AM this morning stating that he was not feeling well and that he did not want to live. She states that she asked him if he had used any substance and he said that he used meth. She states that she knows he has  been overwhelmed due to being behind on his rent. She states that the patient lives alone. She reports that he has a psychiatric history of schizophrenia and is prescribed Topamax, Clonazepam and Zyprexa  Zydis. However she is unable to provide the dosage. She provides me with the number to the on-call nurse, [name] [phone]. I spoke to [place] via telephone who states that she is out of the office and has limited access to the mobile application. She states that she will call this provider when she returns to the office in a couple hours to verify current medication regimen.  Review of Systems  Respiratory: Negative.    Cardiovascular: Negative.      Psychiatric and Social History  Psychiatric History:  Information collected from the patient, EMR, and [name] (peer support)  Prev Dx/Sx: history of schizophrenia and methamphetamine use Current Psych Provider: Dr. Malinda w/[facility]  Home Meds (current): Zyprexa , Topamax and Clonazepam  Previous Med Trials:  UTA Therapy: Yes, peer support   Prior Psych Hospitalization:  UTA Prior Self Harm: UTA Prior Violence: UTA  Family Psych History: UTA Family Hx suicide: UT  Social History:  Developmental Hx: UTA Educational Hx: UTA Occupational Hx: UTA Legal Hx: UTA Living Situation: Lives alone. Spiritual Hx: UTA Access to weapons/lethal means: UTA  Substance History Alcohol: Yes, a little bit  Type of alcohol UTA Last Drink UTA Number of drinks per day UTA History of alcohol withdrawal seizures UTA History of DT's UTA Tobacco: UTA Illicit drugs: Yes, methamphetamine, for awhile and last reported use was the other day.  Prescription drug abuse: UTA Rehab hx: Yes, unable to specify when or where   Exam Findings  Physical Exam:  Vital Signs:  Temp:  [97.6 F (36.4 C)-98.8 F (37.1 C)] 97.6 F (36.4 C) (12/26 1113) Pulse Rate:  [83-105] 83 (12/26 1113) Resp:  [17-18] 17 (12/26 1113) BP:  (118-151)/(75-96) 118/75 (12/26 1113) SpO2:  [96 %-100 %] 100 % (12/26 1113) Blood pressure 118/75, pulse 83, temperature 97.6 F (36.4 C), temperature source Oral, resp. rate 17, SpO2 100%. There is no height or weight on file to calculate BMI.  Physical Exam Cardiovascular:     Rate and Rhythm: Normal rate.     Pulses: Normal pulses.  Musculoskeletal:        General: Normal range of motion.  Neurological:     Mental Status: He is alert. He is disoriented.     Mental Status Exam: General Appearance: Disheveled  Orientation:  Full (Time, Place, and Person)  Memory:  Immediate;   Poor Recent;   Poor Remote;   Poor  Concentration:  Concentration: Poor  Recall:  Poor  Attention  Poor  Eye Contact:  Minimal  Speech:  Slow  Language:  Fair  Volume:  Decreased  Mood: dysphoric   Affect:  Flat  Thought Process:  Disorganized  Thought Content:  Illogical  Suicidal Thoughts:  No  Homicidal Thoughts:  No  Judgement:  Poor  Insight:  Poor   Psychomotor Activity:  Restlessness  Akathisia:  No  Fund of  Knowledge:  Poor      Assets:  Social Support  Cognition:  Impaired   ADL's:  Intact  AIMS (if indicated):        Other History   These have been pulled in through the EMR, reviewed, and updated if appropriate.  Family History:  The patient's family history is not on file.  Medical History: History reviewed. No pertinent past medical history.  Surgical History: Past Surgical History:  Procedure Laterality Date   CYST EXCISION N/A 10/23/2019   Procedure: I&D Facial abscess;  Surgeon: Mable Lenis, MD;  [facility];  Service: ENT;  Laterality: N/A;     Medications:  Current Medications[1]  Allergies: Allergies[2]  Evoleth Nordmeyer L, NP     [1] No current facility-administered medications for this encounter.  Current Outpatient Medications:    mupirocin  ointment (BACTROBAN ) 2 %, Apply topically 2 (two) times daily., Disp: 22 g, Rfl: 0   nicotine   (NICODERM CQ  - DOSED IN MG/24 HOURS) 21 mg/24hr patch, Place 1 patch (21 mg total) onto the skin daily., Disp: 28 patch, Rfl: 0   oxyCODONE -acetaminophen  (PERCOCET/ROXICET) 5-325 MG tablet, Take 2 tablets by mouth every 4 (four) hours as needed for moderate pain., Disp: 20 tablet, Rfl: 0   senna-docusate (SENOKOT-S) 8.6-50 MG tablet, Take 1 tablet by mouth 2 (two) times daily., Disp: , Rfl:  [2] No Known Allergies

## 2024-05-07 NOTE — ED Notes (Signed)
 Officer is leaving   he stated that the patient has been fine but doesn't like to be told to do something

## 2024-05-07 NOTE — Discharge Instructions (Addendum)
-   Present to your primary care provider or an urgent care to have your stitches removed in 7 days  -Follow-up with your outpatient psychiatric provider -instructions on appointment date, time, and address (location) are provided to you in discharge paperwork.  -Take your psychiatric medications as prescribed at discharge - instructions are provided to you in the discharge paperwork  -Follow-up with outpatient primary care doctor and other specialists -for management of preventative medicine and any chronic medical disease.  -Recommend abstinence from alcohol, tobacco, and other illicit drug use at discharge.   -If your psychiatric symptoms recur, worsen, or if you have side effects to your psychiatric medications, call your outpatient psychiatric provider, 911, [phone] or go to the nearest emergency department.  -If suicidal thoughts occur, call your outpatient psychiatric provider, 911, [phone] or go to the nearest emergency department.  Naloxone (Narcan) can help reverse an overdose when given to the victim quickly.  [facility] offers free naloxone kits and instructions/training on its use.  Add naloxone to your first aid kit and you can help save a life.   Pick up your free kit at the following locations:   [place]:  [facility], [address] [place] [address] [phone]) [facility] [address] [place] [address] [phone]) [facility] [address] [place] [address]  [place]: [facility] [address] [place] [address] ([phone]) [facility] [address] [place] [address] [phone])

## 2024-05-07 NOTE — ED Notes (Signed)
 Pt refused to change out pt regular cloths to scrubs informed the nurse

## 2024-05-07 NOTE — ED Triage Notes (Signed)
 Pt here from [employer] [facility] [employer] for eyebrow laceration due to a fall

## 2024-05-07 NOTE — ED Notes (Signed)
 Lled for transport back to [facility]

## 2024-05-07 NOTE — Progress Notes (Signed)
(  Sleep Hours) -5.75  (Any PRNs that were needed, meds refused, or side effects to meds)- none  (Any disturbances and when (visitation, over night)-none  (Concerns raised by the patient)- pt wanted his phone to get numbers to call family , this was done and pt phone returned to pt locker. Pt came back to the unit 2150 , no report called from ED , per notes in chart pt received 8 sutures  (SI/HI/AVH)-denies

## 2024-05-07 NOTE — ED Notes (Signed)
 Writer advised that a urine sample would be needed. Pt advised he couldn't go and needed something to drink. Pt was provided with a cup of water  to help.

## 2024-05-07 NOTE — ED Provider Notes (Signed)
 " [name] Provider Note   CSN: [id number] Arrival date & time: 05/07/24  8082     Patient presents with: Facial Laceration   Brett Wu is a 28 y.o. male.   Patient is a 28 year old male who is currently under IVC who is coming from behavioral health hospital after it was reported that they drew some blood and he started feeling woozy and they told him to sit back down they report they then left the room and heard a crash.  Patient is not forthcoming with any information is unclear if he hit the floor possibly a bookshelf.  Patient does have wounds to the right and left eyebrow area.  He is not reporting if there is any headache or vision changes.  He only keeps repeatedly saying he wants his phone.  Based on records his tetanus shot is not updated.  He appears to be moving his arms and legs without any difficulty.  There is no report of him being on anticoagulation.  The history is provided by the patient.       Prior to Admission medications  Not on File    Allergies: Patient has no known allergies.    Review of Systems  Updated Vital Signs BP 117/83   Pulse 80   Temp 98.3 F (36.8 C) (Oral)   Resp 20   Ht 5' 9 (1.753 m)   Wt 68 kg   SpO2 100%   BMI 22.15 kg/m   Physical Exam Vitals and nursing note reviewed.  Constitutional:      General: He is not in acute distress.    Appearance: He is well-developed.  HENT:     Head: Normocephalic and atraumatic.   Eyes:     Conjunctiva/sclera: Conjunctivae normal.     Pupils: Pupils are equal, round, and reactive to light.     Comments: Pupils are reactive and extraocular movements are intact  Cardiovascular:     Rate and Rhythm: Normal rate and regular rhythm.     Heart sounds: No murmur heard. Pulmonary:     Effort: Pulmonary effort is normal. No respiratory distress.     Breath sounds: Normal breath sounds. No wheezing or rales.  Abdominal:     General: There  is no distension.     Palpations: Abdomen is soft.     Tenderness: There is no abdominal tenderness. There is no guarding or rebound.  Musculoskeletal:        General: No tenderness. Normal range of motion.     Cervical back: Normal range of motion and neck supple. No tenderness.  Skin:    General: Skin is warm and dry.     Findings: No erythema or rash.  Neurological:     Mental Status: He is alert and oriented to person, place, and time.     Comments: Patient is noted to move his arms and legs.  He appears to understand what I am saying to him but will not respond to questions.  Psychiatric:        Behavior: Behavior normal.     (all labs ordered are listed, but only abnormal results are displayed) Labs Reviewed  HIV ANTIBODY (ROUTINE TESTING W REFLEX)  LIPID PANEL  TSH  HEMOGLOBIN A1C  SYPHILIS: RPR W/REFLEX TO RPR TITER AND TREPONEMAL ANTIBODIES, TRADITIONAL SCREENING AND DIAGNOSIS ALGORITHM  HEPATITIS PANEL, ACUTE  GC/CHLAMYDIA PROBE AMP ([place]) NOT AT [facility]    EKG: None ED ECG REPORT  Date: 05/07/2024  Rate: 77   Rhythm: normal sinus rhythm  QRS Axis: normal  Intervals: normal  ST/T Wave abnormalities: normal  Conduction Disutrbances:none  Narrative Interpretation:   Old EKG Reviewed: unchanged  I have personally reviewed the EKG tracing and agree with the computerized printout as noted.  Radiology: CT Head Wo Contrast Result Date: 05/07/2024 EXAM: CT HEAD, FACIAL BONES AND CERVICAL SPINE WITHOUT CONTRAST 05/07/2024 07:52:10 PM TECHNIQUE: CT of the head, facial bones and cervical spine was performed without the administration of intravenous contrast. Multiplanar reformatted images are provided for review. Automated exposure control, iterative reconstruction, and/or weight based adjustment of the mA/kV was utilized to reduce the radiation dose to as low as reasonably achievable. COMPARISON: None available. CLINICAL HISTORY: Head trauma, moderate-severe  FINDINGS: CT HEAD BRAIN AND VENTRICLES: No acute intracranial hemorrhage. No mass effect or midline shift. No extra-axial fluid collection. No evidence of acute infarct. No hydrocephalus. SKULL AND SCALP: No acute skull fracture. No scalp hematoma. CT FACIAL BONES FACIAL BONES: No acute facial fracture. No mandibular dislocation. No suspicious bone lesion. ORBITS: No acute traumatic injury. SINUSES AND MASTOIDS: No acute abnormality. SOFT TISSUES: No acute abnormality. CT CERVICAL SPINE BONES AND ALIGNMENT: No acute fracture or traumatic malalignment. Broad dextrocurvature. DEGENERATIVE CHANGES: No significant degenerative changes. SOFT TISSUES: No prevertebral soft tissue swelling. IMPRESSION: 1. No acute intracranial abnormality. 2. No acute fracture or traumatic malalignment of the cervical spine. 3. No acute fracture of the facial bones. Electronically signed by: Gilmore Molt 05/07/2024 08:53 PM EST RP Workstation: [id number]   CT Maxillofacial Wo Contrast Result Date: 05/07/2024 EXAM: CT HEAD, FACIAL BONES AND CERVICAL SPINE WITHOUT CONTRAST 05/07/2024 07:52:10 PM TECHNIQUE: CT of the head, facial bones and cervical spine was performed without the administration of intravenous contrast. Multiplanar reformatted images are provided for review. Automated exposure control, iterative reconstruction, and/or weight based adjustment of the mA/kV was utilized to reduce the radiation dose to as low as reasonably achievable. COMPARISON: None available. CLINICAL HISTORY: Head trauma, moderate-severe FINDINGS: CT HEAD BRAIN AND VENTRICLES: No acute intracranial hemorrhage. No mass effect or midline shift. No extra-axial fluid collection. No evidence of acute infarct. No hydrocephalus. SKULL AND SCALP: No acute skull fracture. No scalp hematoma. CT FACIAL BONES FACIAL BONES: No acute facial fracture. No mandibular dislocation. No suspicious bone lesion. ORBITS: No acute traumatic injury. SINUSES AND MASTOIDS: No acute  abnormality. SOFT TISSUES: No acute abnormality. CT CERVICAL SPINE BONES AND ALIGNMENT: No acute fracture or traumatic malalignment. Broad dextrocurvature. DEGENERATIVE CHANGES: No significant degenerative changes. SOFT TISSUES: No prevertebral soft tissue swelling. IMPRESSION: 1. No acute intracranial abnormality. 2. No acute fracture or traumatic malalignment of the cervical spine. 3. No acute fracture of the facial bones. Electronically signed by: Gilmore Molt 05/07/2024 08:53 PM EST RP Workstation: [id number]   CT Cervical Spine Wo Contrast Result Date: 05/07/2024 EXAM: CT HEAD, FACIAL BONES AND CERVICAL SPINE WITHOUT CONTRAST 05/07/2024 07:52:10 PM TECHNIQUE: CT of the head, facial bones and cervical spine was performed without the administration of intravenous contrast. Multiplanar reformatted images are provided for review. Automated exposure control, iterative reconstruction, and/or weight based adjustment of the mA/kV was utilized to reduce the radiation dose to as low as reasonably achievable. COMPARISON: None available. CLINICAL HISTORY: Head trauma, moderate-severe FINDINGS: CT HEAD BRAIN AND VENTRICLES: No acute intracranial hemorrhage. No mass effect or midline shift. No extra-axial fluid collection. No evidence of acute infarct. No hydrocephalus. SKULL AND SCALP: No acute skull fracture. No scalp hematoma. CT FACIAL BONES  FACIAL BONES: No acute facial fracture. No mandibular dislocation. No suspicious bone lesion. ORBITS: No acute traumatic injury. SINUSES AND MASTOIDS: No acute abnormality. SOFT TISSUES: No acute abnormality. CT CERVICAL SPINE BONES AND ALIGNMENT: No acute fracture or traumatic malalignment. Broad dextrocurvature. DEGENERATIVE CHANGES: No significant degenerative changes. SOFT TISSUES: No prevertebral soft tissue swelling. IMPRESSION: 1. No acute intracranial abnormality. 2. No acute fracture or traumatic malalignment of the cervical spine. 3. No acute fracture of the facial  bones. Electronically signed by: Gilmore Molt 05/07/2024 08:53 PM EST RP Workstation: [id number]     Procedures  LACERATION REPAIR Performed by: [employer] [facility] Authorized by: Benton Shone Consent: Verbal consent obtained. Risks and benefits: risks, benefits and alternatives were discussed Consent given by: patient Patient identity confirmed: provided demographic data Prepped and Draped in normal sterile fashion Wound explored  Laceration Location: right eyebrow  Laceration Length: 4cm  No Foreign Bodies seen or palpated  Anesthesia: local infiltration  Local anesthetic: lidocaine  2% with epinephrine   Anesthetic total: 4 ml  Irrigation method: syringe Amount of cleaning: standard  Skin closure: 6.0 prolene  Number of sutures: 8  Technique: simple interrupted  Patient tolerance: Patient tolerated the procedure well with no immediate complications. LACERATION REPAIR Performed by: [facility] [facility] Authorized by: Benton Shone Consent: Verbal consent obtained. Risks and benefits: risks, benefits and alternatives were discussed Consent given by: patient Patient identity confirmed: provided demographic data Prepped and Draped in normal sterile fashion Wound explored  Laceration Location: left eyelid  Laceration Length: 3cm  No Foreign Bodies seen or palpated  Anesthesia: local infiltration  Local anesthetic: lidocaine  2% with epinephrine   Anesthetic total: 2 ml  Irrigation method: syringe Amount of cleaning: standard  Skin closure: 6.0 prolene  Number of sutures: 4  Technique: simple interrupted  Patient tolerance: Patient tolerated the procedure well with no immediate complications.  Medications Ordered in the ED  acetaminophen  (TYLENOL ) tablet 650 mg (has no administration in time range)  alum & mag hydroxide-simeth (MAALOX/MYLANTA) 200-200-20 MG/5ML suspension 30 mL (has no administration in time range)  magnesium  hydroxide (MILK OF  MAGNESIA) suspension 30 mL (has no administration in time range)  traZODone  (DESYREL ) tablet 50 mg (has no administration in time range)  hydrOXYzine  (ATARAX ) tablet 25 mg (has no administration in time range)  haloperidol  (HALDOL ) tablet 5 mg (has no administration in time range)    And  diphenhydrAMINE  (BENADRYL ) capsule 50 mg (has no administration in time range)  haloperidol  lactate (HALDOL ) injection 5 mg (has no administration in time range)    And  diphenhydrAMINE  (BENADRYL ) injection 50 mg (has no administration in time range)    And  LORazepam  (ATIVAN ) injection 2 mg (has no administration in time range)  haloperidol  lactate (HALDOL ) injection 10 mg (has no administration in time range)    And  diphenhydrAMINE  (BENADRYL ) injection 50 mg (has no administration in time range)    And  LORazepam  (ATIVAN ) injection 2 mg (has no administration in time range)  OLANZapine  zydis (ZYPREXA ) disintegrating tablet 10 mg (has no administration in time range)  lidocaine -EPINEPHrine  (XYLOCAINE  W/EPI) 2 %-1:200000 (PF) injection 10 mL (has no administration in time range)  Tdap (ADACEL ) injection 0.5 mL (0.5 mLs Intramuscular Given 05/07/24 2004)                                    Medical Decision Making Amount and/or Complexity of Data Reviewed Radiology: ordered and independent interpretation  performed. Decision-making details documented in ED Course. ECG/medicine tests: ordered and independent interpretation performed. Decision-making details documented in ED Course.  Risk Prescription drug management.   Pt presenting today with a complaint that caries a high risk for morbidity and mortality.   Here today after a syncopal episode after it was reported that he had blood taken.  Suspect most likely vasovagal event from getting a needlestick.  Patient fell face first onto the floor.  He has 2 lacerations of both eyebrows.  Does not appear to have any visual impairment.  Patient appears to  be awake and alert.  Will not talk to me but feel that that is because he is choosing not to talk to not because he is unable to communicate.  Patient had labs done earlier today which were reassuring with normal electrolytes and hemoglobin.  EKG done earlier today was normal will repeat to ensure no other cause for syncope.  8:58 PM I have independently visualized and interpreted pt's images today. CT of the head negative for intracranial bleed, face without evidence of bony injury.  Radiology reports no acute intracranial abnormality and no traumatic injury to the facial bones or cervical spine.  On reevaluation patient is now much more talkative and is appropriate at this time.  Wound repaired as above.  At this time patient is stable for discharge back to behavioral health.     Final diagnoses:  Laceration of right eyebrow, initial encounter  Left eyelid laceration, initial encounter    ED Discharge Orders     None          Doretha Folks, MD 05/07/24 2058  "

## 2024-05-07 NOTE — ED Notes (Signed)
 Writer attempted to give report to [facility], however staff advised the nurse was in a meeting.

## 2024-05-07 NOTE — ED Provider Notes (Signed)
 " [name] EMERGENCY DEPARTMENT AT [facility] Provider Note   CSN: [id number] Arrival date & time: 05/07/24  9279     Patient presents with: IVC   Brett Wu is a 28 y.o. male.   Patient is a 28 year old male with a past medical history of schizophrenia and methamphetamine use presenting to the emergency department under IVC.  Patient is IVC need for family from having increasing hallucinations and delusions and reporting to them suicidal thoughts.  The patient is not forthcoming with me and denies any suicidal ideation and describes his shoulders when asked if he is having any hallucinations.  He denies any HI.  He does endorse using meth, denies any other drug or alcohol use.  The history is provided by the patient.       Prior to Admission medications  Medication Sig Start Date End Date Taking? Authorizing Provider  mupirocin  ointment (BACTROBAN ) 2 % Apply topically 2 (two) times daily. 10/25/19   Sebastian Toribio GAILS, MD  nicotine  (NICODERM CQ  - DOSED IN MG/24 HOURS) 21 mg/24hr patch Place 1 patch (21 mg total) onto the skin daily. 10/26/19   Sebastian Toribio GAILS, MD  oxyCODONE -acetaminophen  (PERCOCET/ROXICET) 5-325 MG tablet Take 2 tablets by mouth every 4 (four) hours as needed for moderate pain. 10/25/19   Sebastian Toribio GAILS, MD  senna-docusate (SENOKOT-S) 8.6-50 MG tablet Take 1 tablet by mouth 2 (two) times daily. 10/25/19   Sebastian Toribio GAILS, MD    Allergies: Patient has no known allergies.    Review of Systems  Updated Vital Signs BP 118/75 (BP Location: Right Arm)   Pulse 83   Temp 97.6 F (36.4 C) (Oral)   Resp 17   SpO2 100%   Physical Exam Vitals and nursing note reviewed.  Constitutional:      General: He is not in acute distress.    Appearance: Normal appearance.  HENT:     Head: Normocephalic and atraumatic.     Nose: Nose normal.     Mouth/Throat:     Mouth: Mucous membranes are moist.  Eyes:     Extraocular Movements: Extraocular  movements intact.     Conjunctiva/sclera: Conjunctivae normal.  Cardiovascular:     Rate and Rhythm: Normal rate and regular rhythm.     Heart sounds: Normal heart sounds.  Pulmonary:     Effort: Pulmonary effort is normal.     Breath sounds: Normal breath sounds.  Abdominal:     General: Abdomen is flat.     Palpations: Abdomen is soft.  Musculoskeletal:        General: Normal range of motion.     Cervical back: Normal range of motion.  Skin:    General: Skin is warm and dry.  Neurological:     General: No focal deficit present.     Mental Status: He is alert and oriented to person, place, and time.  Psychiatric:        Behavior: Behavior normal.     Comments: Flat affect     (all labs ordered are listed, but only abnormal results are displayed) Labs Reviewed  COMPREHENSIVE METABOLIC PANEL WITH GFR - Abnormal; Notable for the following components:      Result Value   Glucose, Bld 104 (*)    All other components within normal limits  CBC WITH DIFFERENTIAL/PLATELET - Abnormal; Notable for the following components:   WBC 12.0 (*)    Neutro Abs 8.5 (*)    All other components within normal  limits  ETHANOL  URINE DRUG SCREEN    EKG: EKG Interpretation Date/Time:  Friday May 07 2024 10:48:06 EST Ventricular Rate:  82 PR Interval:  142 QRS Duration:  96 QT Interval:  358 QTC Calculation: 418 R Axis:   69  Text Interpretation: Normal sinus rhythm Nonspecific T wave abnormality Abnormal ECG No previous ECGs available Confirmed by Ellouise Fine ([id number]) on 05/07/2024 11:08:46 AM  Radiology: No results found.   Procedures   Medications Ordered in the ED - No data to display  Clinical Course as of 05/07/24 1330  Fri May 07, 2024  1142 Labs within normal range. Patient is medically cleared for psych eval. [VK]  1329 Patient accepted to [facility]. [VK]    Clinical Course User Index [VK] Kingsley, Deerica Waszak K, DO                                 Medical  Decision Making This patient presents to the ED with chief complaint(s) of SI, hallucinations with pertinent past medical history of schizophrenia, meth use which further complicates the presenting complaint. The complaint involves an extensive differential diagnosis and also carries with it a high risk of complications and morbidity.    The differential diagnosis includes SI, decompensated schizophrenia, intoxication, substance abuse  Additional history obtained: Additional history obtained from N/A Records reviewed IVC records  ED Course and Reassessment: On patient's arrival he is hemodynamically stable in no acute distress without any medical complaints.  He is calm and cooperative.  Was placed under IVC by family, first exam obtained here and he was placed on safety precautions.  He will labs for medical clearance and will need a psychiatry evaluation.  Independent labs interpretation:  The following labs were independently interpreted: within normal range  Independent visualization of imaging: - N/A  Consultation: - Consulted or discussed management/test interpretation w/ external professional: TTS  Consideration for admission or further workup: patient requires inpatient psych admission Social Determinants of health: N/A    Amount and/or Complexity of Data Reviewed Labs: ordered.  Risk Decision regarding hospitalization.       Final diagnoses:  None    ED Discharge Orders     None          Ellouise Fine POUR, DO 05/07/24 1330  "

## 2024-05-07 NOTE — ED Notes (Signed)
 Writer called dispatch and arranged transport for pt to [facility].

## 2024-05-07 NOTE — Progress Notes (Signed)
 Admission: Patient was received safely. Skin assessment completed and is intact. Patient was hesitant to answer questions about substance abuse or history of any type of abuse. He denies SI/HI and did not answer when asked about hallucinations. Patient appears to be preoccupied. He denies using any substances except THC. Denies recent alcohol use. Vitals within normal limits. Patient signed consents to the best of his ability.

## 2024-05-08 DIAGNOSIS — F29 Unspecified psychosis not due to a substance or known physiological condition: Secondary | ICD-10-CM | POA: Diagnosis not present

## 2024-05-08 LAB — SYPHILIS: RPR W/REFLEX TO RPR TITER AND TREPONEMAL ANTIBODIES, TRADITIONAL SCREENING AND DIAGNOSIS ALGORITHM: RPR Ser Ql: NONREACTIVE

## 2024-05-08 MED ORDER — NICOTINE 14 MG/24HR TD PT24
14.0000 mg | MEDICATED_PATCH | Freq: Every day | TRANSDERMAL | Status: DC
  Filled 2024-05-08 (×2): qty 1

## 2024-05-08 NOTE — Progress Notes (Signed)
" °   05/08/24 2300  Psych Admission Type (Psych Patients Only)  Admission Status Involuntary  Psychosocial Assessment  Patient Complaints Anxiety  Eye Contact Fair  Facial Expression Worried;Sad  Affect Sad  Speech Soft;Slow  Interaction Guarded  Motor Activity Slow  Appearance/Hygiene In scrubs  Behavior Characteristics Cooperative  Mood Preoccupied  Thought Process  Coherency WDL  Content WDL  Delusions None reported or observed  Perception Hallucinations  Hallucination Auditory  Judgment Poor  Confusion Mild  Danger to Self  Current suicidal ideation? Denies  Danger to Others  Danger to Others None reported or observed    "

## 2024-05-08 NOTE — [facility] Group Notes (Signed)
 Adult Psychoeducational Group Note  Date:  05/08/2024 Time:  2:04 PM  Group Topic/Focus: Goals Group  Participation Level:  Did Not Attend  Participation Quality:    Affect:    Cognitive:    Insight:   Engagement in Group:    Modes of Intervention:    Additional Comments:    Jamelle Cassondra KIDD 05/08/2024, 2:04 PM

## 2024-05-08 NOTE — Group Note (Signed)
 Date:  05/08/2024 Time:  9:14 PM  Group Topic/Focus:  Wrap-Up Group:   The focus of this group is to help patients review their daily goal of treatment and discuss progress on daily workbooks.    Participation Level:  Active  Participation Quality:  Appropriate  Affect:  Appropriate  Cognitive:  Appropriate  Insight: Appropriate  Engagement in Group:  Engaged  Modes of Intervention:  Education  Additional Comments:  Patient attended and participated in group tonight. He reports that today he sleeps, watched television, went for meals, His doctor visit him early in the morning bust he was sleeping.  Gwenn Chillington Dacosta 05/08/2024, 9:14 PM

## 2024-05-08 NOTE — Group Note (Signed)
" °  [facility] LCSW Group Therapy Note  Date/Time:  05/08/2024 11:15AM-12:00PM  Type of Therapy and Topic:  Group Therapy:  Feelings About Hospitalization  Participation Level:  Did Not Attend   Description of Group This process group involved patients discussing their feelings related to being hospitalized, as well as the benefits they see to being in the hospital.  These feelings and benefits were itemized.  The group then brainstormed specific ways in which they could seek those same benefits when they discharge and return home.  Therapeutic Goals Patient will identify and describe positive and negative feelings related to hospitalization Patient will verbalize benefits of hospitalization to themselves personally Patients will brainstorm together ways they can obtain similar benefits in the outpatient setting, identify barriers to wellness and possible solutions  Summary of Patient Progress:  Patient was invited to group, did not attend.   Therapeutic Modalities Cognitive Behavioral Therapy Motivational Interviewing    Elgie Crest, LCSW 05/08/2024, 1:55 PM  .  "

## 2024-05-08 NOTE — [facility] Group Notes (Signed)
 Adult Psychoeducational Group Note  Date:  05/08/2024 Time:  3:44 PM  Group Topic/Focus: Emotional Wellness  Participation Level:  Did Not Attend  Participation Quality:    Affect:    Cognitive:    Insight:   Engagement in Group:    Modes of Intervention:    Additional Comments:    Brett Wu 05/08/2024, 3:44 PM

## 2024-05-08 NOTE — [facility] Suicide Risk Assessment (Signed)
 Suicide Risk Assessment  Admission Assessment    [facility] Admission Suicide Risk Assessment   Nursing information obtained from:  Patient Demographic factors:  Male, Adolescent or young adult, Caucasian, Low socioeconomic status Current Mental Status:  Suicidal ideation indicated by patient Loss Factors:  Financial problems / change in socioeconomic status Historical Factors:  Prior suicide attempts Risk Reduction Factors:  Positive coping skills or problem solving skills  Principal Problem: Schizophrenia (HCC) Diagnosis:  Principal Problem:   Schizophrenia (HCC)  Subjective Data: Brett Wu is a 28 y.o. male  with a past psychiatric history of schizophrenia, methamphetamine use, and cannabis use disorder. Patient initially arrived to [facility] on 05/07/24 for increasing auditory hallucinations, delusions, and suicidal ideation and was admitted to [facility] under IVC on 05/07/24 for acute safety concerns and severe substance-induced psychosis or mood disturbances. No significant past medical history.   Per chart review, patient was IVC'd by his peer support specialist from his ACT team out of concern for worsening auditory hallucinations, delusions, and suicidal ideation. He called her around 2:30 AM yesterday morning stating that he was not feeling well and expressing a desire to die. She noted that the patient has been feeling overwhelmed due to being behind on his rent. Although patient had previously expressed suicidal thoughts to family members, he denied this during ED assessment, stating he would not harm himself out of fear of not being forgiven. Patient admits to using methamphetamine for a while and states his last use was the other day. His home medications include Zyprexa , Klonopin and Topamax. Unclear if he was been compliant with medications.    Continued Clinical Symptoms:  Alcohol Use Disorder Identification Test Final Score (AUDIT): 2 The Alcohol Use Disorders Identification Test,  Guidelines for Use in Primary Care, Second Edition.  World Science Writer [facility]). Score between 0-7:  no or low risk or alcohol related problems. Score between 8-15:  moderate risk of alcohol related problems. Score between 16-19:  high risk of alcohol related problems. Score 20 or above:  warrants further diagnostic evaluation for alcohol dependence and treatment.   CLINICAL FACTORS:   Acute psychosis, suicidal ideation, substance use  Musculoskeletal: Strength & Muscle Tone: within normal limits Gait & Station: normal Patient leans: N/A  Psychiatric Specialty Exam:  Appearance: Young caucasian male lying in bed dressed is paper scrubs; laceration of right eyebrow; bruised right eye  Behavior: somnolent, not responding to verbal stimuli  Attitude: UTA  Speech: UTA  Mood: UTA  Affect: UTA  Thought Process: UTA  Thought Content: UTA  SI/HI: UTA  Perceptions: UTA  Judgement: Impaired  Insight: UTA  Fund of Knowledge: WNL    Physical Exam: Vitals and nursing note reviewed.  Constitutional:      General: He is not in acute distress.    Appearance: Normal appearance. He is normal weight. He is not ill-appearing.  HENT:     Head: Normocephalic and atraumatic.     Comments: 3 cm laceration to the right eyebrow 2 cm laceration to area above the left upper eyelid Purplish-blue discoloration of the right eyelid Pulmonary:     Effort: Pulmonary effort is normal. No respiratory distress.  Neurological:     General: No focal deficit present.     Mental Status: He is alert and oriented to person, place, and time. Mental status is at baseline.     Review of Systems  Gastrointestinal:  Negative for abdominal pain, constipation, diarrhea, nausea and vomiting.  Neurological:  Negative for dizziness and  headaches.  All other systems reviewed and are negative. Blood pressure 117/83, pulse 80, temperature 98.3 F (36.8 C), temperature source Oral, resp. rate 20, height 5' 9  (1.753 m), weight 68 kg, SpO2 100%. Body mass index is 22.15 kg/m.   COGNITIVE FEATURES THAT CONTRIBUTE TO RISK:  None    SUICIDE RISK:   Moderate:  Frequent suicidal ideation with limited intensity, and duration, some specificity in terms of plans, no associated intent, good self-control, limited dysphoria/symptomatology, some risk factors present, and identifiable protective factors, including available and accessible social support.  PLAN OF CARE:  Patient is a 28 year old male with a past psychiatric history of methamphetamine use disorder, schizophrenia, cannabis use disorder presenting for increasing auditory hallucinations, delusions, and suicidal ideation. Differential diagnosis includes primary psychotic disorder vs substance-induced psychosis vs substance-induced mood disorder. Per chart review, patient has had a good response to Zyprexa  in the past, however had not been compliant with medications outpatient. Plan to continue Zyprexa  given past effectiveness.   1. Psychiatric Diagnoses and Treatment   # Primary psychotic disorder vs substance-induced psychosis -- Continue Zyprexa  Zydis 10 mg qhs   PRN's - Trazodone  50 mg at bedtime as needed for insomnia - Atarax  25 mg TID as needed for anxiety - Agitation Protocol: haldol  + ativan  + benadryl    2. Active Medical Issues   #Nicotine  withdrawal - Patient in need of nicotine  replacement; nicotine  patch 14 mg / 24 hours ordered. Smoking cessation encouraged   Other as needed medications Tylenol  650 mg every 6 hours as needed for pain Mylanta 30 mL every 4 hours as needed for indigestion Milk of magnesia 30 mL daily as needed for constipation  I certify that inpatient services furnished can reasonably be expected to improve the patient's condition.   Ashley LOISE Gravely, MD 05/08/2024, 12:26 PM

## 2024-05-08 NOTE — Plan of Care (Signed)
" °  Problem: Education: Goal: Mental status will improve Outcome: Progressing   Problem: Activity: Goal: Interest or engagement in activities will improve Outcome: Not Met (add Reason) Goal: Sleeping patterns will improve Outcome: Progressing   "

## 2024-05-08 NOTE — [facility] Group Notes (Signed)
 Adult Psychoeducational Group Note  Date:  05/08/2024 Time:  2:05 PM  Group Topic/Focus: Social Work   Participation Level:  Did Not Attend  Participation Quality:    Affect:    Cognitive:    Insight:   Engagement in Group:    Modes of Intervention:    Additional Comments:    Brett Wu 05/08/2024, 2:05 PM

## 2024-05-08 NOTE — H&P (Signed)
 Psychiatric Admission Assessment Adult  Patient Identification:  Brett Wu MRN:  [id number] Date of Evaluation:  05/08/2024 Chief Complaint:  Schizophrenia (HCC) [F20.9] Left eyelid laceration, initial encounter [S01.112A] Laceration of right eyebrow, initial encounter [S01.111A] Principal Diagnosis:  Schizophrenia (HCC) Diagnosis:  Principal Problem:   Schizophrenia (HCC)   SUBJECTIVE:  HPI: Brett Wu is a 28 y.o. male  with a past psychiatric history of schizophrenia, methamphetamine use, and cannabis use disorder. Patient initially arrived to [facility] on 05/07/24 for increasing auditory hallucinations, delusions, and suicidal ideation and was admitted to [facility] under IVC on 05/07/24 for acute safety concerns and severe substance-induced psychosis or mood disturbances. No significant past medical history.  Despite attempts to interview the patient on two separate occasions, he remained too somnolent to assess and would not respond to verbal stimuli. According to nursing staff, patient has been sleeping throughout the day. No acute safety concerns noted by staff.  Per chart review: Patient was IVC'd by his peer support specialist from his ACT team out of concern for worsening auditory hallucinations, delusions, and suicidal ideation. He called her around 2:30 AM yesterday morning stating that he was not feeling well and expressing a desire to die. She noted that the patient has been feeling overwhelmed due to being behind on his rent. Although patient had previously expressed suicidal thoughts to family members, he denied this during ED assessment, stating he would not harm himself out of fear of not being forgiven. Patient admits to using methamphetamine for a while and states his last use was the other day. His home medications include Zyprexa , Klonopin and Topamax. Unclear if he was been compliant with medications.  Past Psychiatric History: Current psychiatrist: Dr. Malinda; follows  with [facility] ACT team Current therapist: Yes, peer support Previous psychiatric diagnoses: schizophrenia, methamphetamine use disorder, cannabis use disorder Psychiatric Medications:  -Home Meds: olanzapine  ODT 15 mg at bedtime (has been non-adherent), clonazepam, Topamax  -Past Med Trials: Unknown Psychiatric hospitalization(s): Yes Psychotherapy history: Peer support with ACT team Neuromodulation history: No History of suicide (obtained from HPI): Unknown History of homicide or aggression (obtained in HPI): Unknown NSSIB: Unknown  Substance Abuse History: History of methamphetamine and THC use. UDS this admission positive for amphetamines. Reports smoking and snorting meth. Last use was the other day. Denies IV drug use.   Past Medical History: Medical diagnoses: None Medications: None Allergies: NKDA Trauma: Unknown Seizures: Unknown  Social History: Current living situation: Lives alone Education: UTA Occupational history: UTA Marital status: UTA Children: UTA Legal: UTA Military: UTA  Access to firearms: UTA  Family Psychiatric History: Mother has a history of bipolar disorder.  Columbia Scale:  Flowsheet Row ED to Hosp-Admission (Current) [facility] Most recent reading at 05/07/2024  7:36 PM ED from 05/07/2024 in [facility] Emergency Department at [facility] Most recent reading at 05/07/2024  7:37 AM ED from 10/22/2021 in [facility] Emergency Department at [facility] Most recent reading at 10/22/2021  6:07 PM  C-SSRS RISK CATEGORY No Risk No Risk No Risk     Tobacco Screening:  Tobacco Use History[1]  BH Tobacco Counseling     Are you interested in Tobacco Cessation Medications?  No, patient refused Counseled patient on smoking cessation:  Refused/Declined practical counseling Reason Tobacco Screening Not Completed: Patient Refused Screening    Allergies:    Allergies[2]  OBJECTIVE:  Physical Examination:  Physical Exam Vitals and nursing note reviewed.  Constitutional:  General: He is not in acute distress.    Appearance: Normal appearance. He is normal weight. He is not ill-appearing.  HENT:     Head: Normocephalic and atraumatic.     Comments: 3 cm laceration to the right eyebrow 2 cm laceration to area above the left upper eyelid Purplish-blue discoloration of the right eyelid Pulmonary:     Effort: Pulmonary effort is normal. No respiratory distress.  Neurological:     General: No focal deficit present.     Mental Status: He is alert and oriented to person, place, and time. Mental status is at baseline.    Review of Systems  Gastrointestinal:  Negative for abdominal pain, constipation, diarrhea, nausea and vomiting.  Neurological:  Negative for dizziness and headaches.  All other systems reviewed and are negative.  Blood pressure 117/83, pulse 80, temperature 98.3 F (36.8 C), temperature source Oral, resp. rate 20, height 5' 9 (1.753 m), weight 68 kg, SpO2 100%. Body mass index is 22.15 kg/m.  Metabolic disorder labs:  No results found for: HGBA1C, MPG No results found for: PROLACTIN No results found for: CHOL, TRIG, HDL, CHOLHDL, VLDL, LDLCALC  Results for orders placed or performed during the hospital encounter of 05/07/24 (from the past 48 hours)  HIV Antibody (routine testing w rflx)     Status: None   Collection Time: 05/07/24  6:41 PM  Result Value Ref Range   HIV Screen 4th Generation wRfx Non Reactive Non Reactive    Comment: Performed at [facility], [address], [place], [place] [address]    Blood alcohol level:  Lab Results  Component Value Date   [facility] <15 05/07/2024   ETH <10 04/30/2019    Current Medications: Current Facility-Administered Medications  Medication Dose Route Frequency Provider Last Rate Last Admin   acetaminophen  (TYLENOL ) tablet 650 mg  650 mg Oral  Q6H PRN White, Patrice L, NP       alum & mag hydroxide-simeth (MAALOX/MYLANTA) 200-200-20 MG/5ML suspension 30 mL  30 mL Oral Q4H PRN White, Patrice L, NP       haloperidol  (HALDOL ) tablet 5 mg  5 mg Oral TID PRN White, Patrice L, NP       And   diphenhydrAMINE  (BENADRYL ) capsule 50 mg  50 mg Oral TID PRN White, Patrice L, NP       haloperidol  lactate (HALDOL ) injection 5 mg  5 mg Intramuscular TID PRN White, Patrice L, NP       And   diphenhydrAMINE  (BENADRYL ) injection 50 mg  50 mg Intramuscular TID PRN White, Patrice L, NP       And   LORazepam  (ATIVAN ) injection 2 mg  2 mg Intramuscular TID PRN White, Patrice L, NP       haloperidol  lactate (HALDOL ) injection 10 mg  10 mg Intramuscular TID PRN White, Patrice L, NP       And   diphenhydrAMINE  (BENADRYL ) injection 50 mg  50 mg Intramuscular TID PRN White, Patrice L, NP       And   LORazepam  (ATIVAN ) injection 2 mg  2 mg Intramuscular TID PRN White, Patrice L, NP       hydrOXYzine  (ATARAX ) tablet 25 mg  25 mg Oral TID PRN White, Patrice L, NP       magnesium  hydroxide (MILK OF MAGNESIA) suspension 30 mL  30 mL Oral Daily PRN White, Patrice L, NP       OLANZapine  zydis (ZYPREXA ) disintegrating tablet 10 mg  10 mg  Oral QHS White, Patrice L, NP   10 mg at 05/07/24 2202   traZODone  (DESYREL ) tablet 50 mg  50 mg Oral QHS PRN White, Patrice L, NP        PTA Medications: No medications prior to admission.    Mental Status Exam:  Appearance: Young caucasian male lying in bed dressed is paper scrubs; laceration of right eyebrow; bruised right eye  Behavior: somnolent, not responding to verbal stimuli  Attitude: UTA  Speech: UTA  Mood: UTA  Affect: UTA  Thought Process: UTA  Thought Content: UTA  SI/HI: UTA  Perceptions: UTA  Judgement: Impaired  Insight: UTA  Fund of Knowledge: WNL    ASSESSMENT: Patient is a 28 year old male with a past psychiatric history of methamphetamine use disorder, schizophrenia, cannabis use disorder  presenting for increasing auditory hallucinations, delusions, and suicidal ideation. Differential diagnosis includes primary psychotic disorder vs substance-induced psychosis vs substance-induced mood disorder. Per chart review, patient has had a good response to Zyprexa  in the past, however had not been compliant with medications outpatient. Plan to continue Zyprexa  given past effectiveness.   PLAN:  Psychiatric Diagnoses and Treatment  # Primary psychotic disorder vs substance-induced psychosis -- Continue Zyprexa  Zydis 10 mg qhs  PRN's - Trazodone  50 mg at bedtime as needed for insomnia - Atarax  25 mg TID as needed for anxiety - Agitation Protocol: haldol  + ativan  + benadryl   2. Active Medical Issues  #Nicotine  withdrawal - Patient in need of nicotine  replacement; nicotine  patch 14 mg / 24 hours ordered. Smoking cessation encouraged  Other as needed medications Tylenol  650 mg every 6 hours as needed for pain Mylanta 30 mL every 4 hours as needed for indigestion Milk of magnesia 30 mL daily as needed for constipation  The risks/benefits/side-effects/alternatives to the above medication(s) were discussed in detail with the patient and time was given for questions. The patient consents to medication trial. FDA black box warnings, if present, were discussed.  3. Safety and Monitoring: - Involuntary admission to inpatient psychiatric unit for safety, stabilization and treatment - Daily contact with patient to assess and evaluate symptoms and progress in treatment - Patient's case to be discussed in multi-disciplinary team meeting - Observation Level: q15 minute checks - Vital signs:  q12 hours - Precautions: suicide, elopement, and assault  4. Routine and other pertinent labs: EKG monitoring: QTc: 421 on 05/07/24  Metabolism / endocrine: BMI: Body mass index is 22.15 kg/m.  CBC: WBC 12 CMP: unremarkable UDS: pos for amphetamines Ethanol: <10 TSH: pending A1c:  pending Lipid panel: pending HIV: negative RPR: pending  5.   Group Therapy: - Encouraged patient to participate in unit milieu and in scheduled group therapies  - Short Term Goals: Ability to identify changes in lifestyle to reduce recurrence of condition will improve and Ability to identify triggers associated with substance abuse/mental health issues will improve - Long Term Goals: Improvement in symptoms so as ready for discharge - Patient is encouraged to participate in group therapy while admitted to the psychiatric unit. - We will address other chronic and acute stressors, which contributed to the patient's Schizophrenia (HCC) in order to reduce the risk of self-harm at discharge.  7.   Discharge Planning:  - Social work and case management to assist with discharge planning and identification of hospital follow-up needs prior to discharge - Estimated LOS: 5-7 days - Discharge Concerns: Need to establish a safety plan; Medication compliance and effectiveness - Discharge Goals: Return home with outpatient referrals for mental  health follow-up including medication management/psychotherapy.  I certify that inpatient services furnished can reasonably be expected to improve the patient's condition.      Ashley LOISE Gravely, MD PGY-1, Psychiatry Residency  12/27/20258:01 AM       [1]  Social History Tobacco Use  Smoking Status Every Day   Current packs/day: 0.50   Types: Cigarettes  Smokeless Tobacco Never  Tobacco Comments    A lot   [2] No Known Allergies

## 2024-05-08 NOTE — Group Note (Signed)
 Date:  05/16/2024 Time:  11:20 PM  Group Topic/Focus:  Wrap-Up Group:   The focus of this group is to help patients review their daily goal of treatment and discuss progress on daily workbooks.    Participation Level:  Did Not Attend  Participation Quality:  Did Not Attend  Affect:  Did Not Attend  Cognitive:  Did Not Attend  Insight: None  Engagement in Group:  Did Not Attend  Modes of Intervention:  Did Not Attend  Additional Comments:  Did Not Attend  Keishia Ground Dacosta 05/16/2024, 11:20 PM

## 2024-05-08 NOTE — [facility] Group Notes (Signed)
 Adult Psychoeducational Group Note  Date:  05/08/2024 Time:  3:43 PM  Group Topic/Focus: Physical Wellness  Participation Level:  Did Not Attend  Participation Quality:    Affect:    Cognitive:    Insight:   Engagement in Group:    Modes of Intervention:    Additional Comments:    Brett Wu 05/08/2024, 3:43 PM

## 2024-05-08 NOTE — [facility] Counselor (Signed)
 Adult Comprehensive Assessment  Patient ID: Brett Wu, male   DOB: February 11, 1996, 28 y.o.   MRN: [id number] 1st Attempt to complete PSA unsuccessful.  Patient states I just want to talk to my uncle I'm tired of this ...  CSW will make another attempt to complete the assessment.   Ennifer Harston JONELLE Lever. 05/08/2024

## 2024-05-09 DIAGNOSIS — F29 Unspecified psychosis not due to a substance or known physiological condition: Secondary | ICD-10-CM | POA: Diagnosis not present

## 2024-05-09 NOTE — Group Note (Signed)
 Date:  05/09/2024 Time:  9:03 PM  Group Topic/Focus:  Wrap-Up Group:   The focus of this group is to help patients review their daily goal of treatment and discuss progress on daily workbooks.    Participation Level:  Did Not Attend    Haile Bosler Dacosta 05/09/2024, 9:03 PM

## 2024-05-09 NOTE — Group Note (Signed)
 Date:  05/09/2024 Time:  10:20 AM  Group Topic/Focus:  Emotional Education:   The focus of this group is to discuss what feelings/emotions are, and how they are experienced. Wellness Toolbox:   The focus of this group is to discuss various aspects of wellness, balancing those aspects and exploring ways to increase the ability to experience wellness.  Patients will create a wellness toolbox for use upon discharge.    Participation Level:  Did Not Attend  Brett Wu 05/09/2024, 10:20 AM

## 2024-05-09 NOTE — Group Note (Signed)
 Date:  05/09/2024 Time:  9:39 AM  Group Topic/Focus:  Goals Group:   The focus of this group is to help patients establish daily goals to achieve during treatment and discuss how the patient can incorporate goal setting into their daily lives to aide in recovery. Orientation:   The focus of this group is to educate the patient on the purpose and policies of crisis stabilization and provide a format to answer questions about their admission.  The group details unit policies and expectations of patients while admitted.    Participation Level:  Did Not Attend  Brett Wu 05/09/2024, 9:39 AM

## 2024-05-09 NOTE — Group Note (Signed)
 Date:  05/09/2024 Time:  6:12 PM  Group Topic/Focus:  Self Care:   The focus of this group is to help patients understand the importance of self-care in order to improve or restore emotional, physical, spiritual, interpersonal, and financial health.    Participation Level:  Did Not Attend   Brett Wu 05/09/2024, 6:12 PM

## 2024-05-09 NOTE — Group Note (Signed)
 Date:  05/09/2024 Time:  3:49 PM  Group Topic/Focus:  Self Care:   The focus of this group is to help patients understand the importance of self-care and sleep hygiene in order to improve or restore emotional, physical, spiritual, interpersonal, and financial health.    Additional Comments:  patient did not attend   Brett Wu 05/09/2024, 3:49 PM

## 2024-05-09 NOTE — Plan of Care (Signed)
 Pt isolated to his room all day and only ate dinner and not the previous meals which were on his bench. He only seemed to eat dinner because it was handed to him while in bed. Denied SI/HI/paranoia/AVH. Will continue to monitor.

## 2024-05-09 NOTE — Progress Notes (Signed)
 [facility] Inpatient Psychiatry Progress Note  Date: 05/09/2024 Patient: Brett Wu MRN: [id number]   ASSESSMENT:  Brett Wu is a 28 y.o. male  with a past psychiatric history of schizophrenia, methamphetamine use, and cannabis use disorder. Patient initially arrived to [facility] on 05/07/24 for increasing auditory hallucinations, delusions, and suicidal ideation and was admitted to [facility] under IVC on 05/07/24 for acute safety concerns and severe substance-induced psychosis or mood disturbances. No significant past medical history.   Patient is a 28 year old male with a past psychiatric history of methamphetamine use disorder, schizophrenia, cannabis use disorder presenting for increasing auditory hallucinations, delusions, and suicidal ideation. Differential diagnosis includes primary psychotic disorder vs substance-induced psychosis vs substance-induced mood disorder. Per chart review, patient has had a good response to Zyprexa  in the past, however had not been compliant with medications outpatient. Plan to continue Zyprexa  given past effectiveness. '  12/28: Patient was again too somnolent to interview this a.m., although has been awake in the p.m. as of yesterday.  Will reattempt interview later in day.  CT head negative.  Awaiting further labwork.  No medication changes indicated at this time.  Given degree of somnolence, suspicions leaning more towards methamphetamine withdrawal.  Will continue Zyprexa  at a slightly lower than home dose of 10 mg (likely noncompliant.) attempted to reach IVC petitioner Brett Wu, was unable to.  PLAN:  # Primary psychotic disorder vs substance-induced psychosis -- Continue Zyprexa  Zydis 10 mg qhs   PRN's - Trazodone  50 mg at bedtime as needed for insomnia - Atarax  25 mg TID as needed for anxiety - Agitation Protocol: haldol  + ativan  + benadryl    2. Active Medical Issues   #Nicotine  withdrawal - Patient in need of nicotine   replacement; nicotine  patch 14 mg / 24 hours ordered. Smoking cessation encouraged   Other as needed medications Tylenol  650 mg every 6 hours as needed for pain Mylanta 30 mL every 4 hours as needed for indigestion Milk of magnesia 30 mL daily as needed for constipation   The risks/benefits/side-effects/alternatives to the above medication(s) were discussed in detail with the patient and time was given for questions. The patient consents to medication trial. FDA black box warnings, if present, were discussed.   3. Safety and Monitoring: - Involuntary admission to inpatient psychiatric unit for safety, stabilization and treatment - Daily contact with patient to assess and evaluate symptoms and progress in treatment - Patient's case to be discussed in multi-disciplinary team meeting - Observation Level: q15 minute checks - Vital signs:  q12 hours - Precautions: suicide, elopement, and assault   4. Routine and other pertinent labs: EKG monitoring: QTc: 421 on 05/07/24   Metabolism / endocrine: BMI: Body mass index is 22.15 kg/m.   CBC: WBC 12 CMP: unremarkable UDS: pos for amphetamines Ethanol: <10 TSH: pending A1c: pending Lipid panel: pending HIV: negative RPR: pending   Risk Assessment: Patient continues to require inpatient hospitalization for safety and stabilization of acute psychosis.  Discharge Planning: Barriers to Discharge: resolution of psychosis Estimated Length of Stay: 7-14 days Predicted Discharge Location: group home  INTERVAL HISTORY: No labs gathered this AM. Refused nicotine  patch but did not refuse zyprexa . No PRNs given. No overt concerns. No sleep data.   On interview, patient was sleeping on his side and did not wake to voice or light touch.  Resting comfortably.  Physical Examination:  Vitals and nursing note reviewed MSK: Normal gait and station  MENTAL STATUS EXAM:  Appearance: well appearing male  under blankets, sleeping, no acute distress   Behavior: UTA   Attitude: UTA   Speech: UTA   Mood: UTA  Affect: UTA  Thought Process: UTA   Thought Content: UTA  SI/HI: UTA  Perceptions: UTA  Judgement: UTA  Insight: UTA  Fund of Knowledge: WNL   Lab Results:  Admission on 05/07/2024  Component Date Value Ref Range Status   HIV Screen 4th Generation wRfx 05/07/2024 Non Reactive  Non Reactive Final   RPR Ser Ql 05/08/2024 NON REACTIVE  NON REACTIVE Final     Vitals: Blood pressure 117/83, pulse 80, temperature 98.3 F (36.8 C), temperature source Oral, resp. rate 20, height 5' 9 (1.753 m), weight 68 kg, SpO2 100%.    Brett Wu PGY-2, Psychiatry Residency  05/09/2024, 8:05 AM

## 2024-05-09 NOTE — BHH Counselor (Signed)
 Adult Comprehensive Assessment  Patient ID: Brett Wu, male   DOB: 1995/06/13, 28 y.o.   MRN: [id number]  2nd Attempt to complete PSA unsuccessful.  Patient asleep and did not respond.    [name]. 05/09/2024

## 2024-05-10 ENCOUNTER — Encounter (HOSPITAL_COMMUNITY): Payer: Self-pay

## 2024-05-10 DIAGNOSIS — F19959 Other psychoactive substance use, unspecified with psychoactive substance-induced psychotic disorder, unspecified: Secondary | ICD-10-CM | POA: Diagnosis not present

## 2024-05-10 DIAGNOSIS — F29 Unspecified psychosis not due to a substance or known physiological condition: Secondary | ICD-10-CM | POA: Diagnosis not present

## 2024-05-10 LAB — GC/CHLAMYDIA PROBE AMP (~~LOC~~) NOT AT ARMC
Chlamydia: NEGATIVE
Comment: NEGATIVE
Comment: NORMAL
Neisseria Gonorrhea: NEGATIVE

## 2024-05-10 MED ORDER — OLANZAPINE 15 MG PO TBDP
15.0000 mg | ORAL_TABLET | Freq: Every day | ORAL | Status: DC
  Administered 2024-05-10 – 2024-05-14 (×5): 15 mg via ORAL
  Filled 2024-05-10 (×5): qty 1

## 2024-05-10 NOTE — BHH Group Notes (Signed)
 Adult Psychoeducational Group Note  Date:  05/10/2024 Time:  8:06 PM  Group Topic/Focus:  Wrap-Up Group:   The focus of this group is to help patients review their daily goal of treatment and discuss progress on daily workbooks.  Participation Level:  Active  Participation Quality:  Attentive  Affect:  Appropriate  Cognitive:  Alert  Insight: Appropriate  Engagement in Group:  Engaged  Modes of Intervention:  Discussion  Additional Comments:  Patient attended and participated in the Wrap-up group.  Brett Wu 05/10/2024, 8:06 PM

## 2024-05-10 NOTE — Progress Notes (Signed)
 [facility] Inpatient Psychiatry Progress Note  Date: 05/10/2024 Patient: Brett Wu MRN: [id number]   ASSESSMENT:  Brett Wu is a 28 y.o. male  with a past psychiatric history of schizophrenia, methamphetamine use, and cannabis use disorder. Patient initially arrived to [facility] on 05/07/24 for increasing auditory hallucinations, delusions, and suicidal ideation and was admitted to [facility] under IVC on 05/07/24 for acute safety concerns and severe substance-induced psychosis or mood disturbances. No significant past medical history.   Patient is a 28 year old male with a past psychiatric history of methamphetamine use disorder, schizophrenia, cannabis use disorder presenting for increasing auditory hallucinations, delusions, and suicidal ideation. Differential diagnosis includes primary psychotic disorder vs substance-induced psychosis vs substance-induced mood disorder. Per chart review, patient has had a good response to Zyprexa  in the past, however had not been compliant with medications outpatient. Plan to continue Zyprexa  given past effectiveness. '  12/29: Patient continues to avoid interacting with the team. Is clearly awake when team is questioning him while in bed, but says nothing.  This is perhaps related to his previous paranoid thinking.  Ate dinner last night. We will increase zyprexa  to home dose of 15 mg at bedtime and reevaluate tomorrow, reach out again to ACT team.   PLAN:  # Primary psychotic disorder vs substance-induced psychosis -- Increase Zyprexa  Zydis 10 --> 15 mg qhs   PRN's - Trazodone  50 mg at bedtime as needed for insomnia - Atarax  25 mg TID as needed for anxiety - Agitation Protocol: haldol  + ativan  + benadryl    2. Active Medical Issues   #Nicotine  withdrawal - Patient in need of nicotine  replacement; nicotine  patch 14 mg / 24 hours ordered. Smoking cessation encouraged   Other as needed medications Tylenol  650 mg every 6 hours as  needed for pain Mylanta 30 mL every 4 hours as needed for indigestion Milk of magnesia 30 mL daily as needed for constipation   The risks/benefits/side-effects/alternatives to the above medication(s) were discussed in detail with the patient and time was given for questions. The patient consents to medication trial. FDA black box warnings, if present, were discussed.   3. Safety and Monitoring: - Involuntary admission to inpatient psychiatric unit for safety, stabilization and treatment - Daily contact with patient to assess and evaluate symptoms and progress in treatment - Patient's case to be discussed in multi-disciplinary team meeting - Observation Level: q15 minute checks - Vital signs:  q12 hours - Precautions: suicide, elopement, and assault   4. Routine and other pertinent labs: EKG monitoring: QTc: 421 on 05/07/24   Metabolism / endocrine: BMI: Body mass index is 22.15 kg/m.   CBC: WBC 12 CMP: unremarkable UDS: pos for amphetamines Ethanol: <10 TSH: pending A1c: pending Lipid panel: pending HIV: negative RPR: pending   Risk Assessment: Patient continues to require inpatient hospitalization for safety and stabilization of acute psychosis.  Discharge Planning: Barriers to Discharge: resolution of psychosis Estimated Length of Stay: 7-14 days Predicted Discharge Location: group home  INTERVAL HISTORY: Accepted zyprexa  10 mg. No PRNs. Isolated to his room all day. Did not eat meals placed in room for patient -- ate dinner, but likely only because it was placed near his bed.   Patient lays in bed under covers, which are wrapped around his face. Respirating well. When provider gently shook patient's leg to determine if he was asleep or awake, patient retracted the leg. On attempting to remove the blanket from his face, patient would not let team.   Physical  Examination:  Vitals and nursing note reviewed MSK: largely able to move limbs  MENTAL STATUS  EXAM:  Appearance: well appearing male under blankets, awake but hiding under blankets, no acute distress, moves foot to manual stimulus, does not respond to verbal stimulus   Behavior: UTA   Attitude: UTA   Speech: UTA   Mood: UTA  Affect: UTA  Thought Process: UTA   Thought Content: UTA  SI/HI: UTA  Perceptions: UTA  Judgement: UTA  Insight: UTA  Fund of Knowledge: UTA   Lab Results:  Admission on 05/07/2024  Component Date Value Ref Range Status   HIV Screen 4th Generation wRfx 05/07/2024 Non Reactive  Non Reactive Final   RPR Ser Ql 05/08/2024 NON REACTIVE  NON REACTIVE Final     Vitals: Blood pressure 117/83, pulse 80, temperature 98.3 F (36.8 C), temperature source Oral, resp. rate 20, height 5' 9 (1.753 m), weight 68 kg, SpO2 100%.    Odis Cleveland PGY-2, Psychiatry Residency  05/10/2024, 10:28 AM

## 2024-05-10 NOTE — Group Note (Signed)
 Date:  05/10/2024 Time:  9:32 AM  Group Topic/Focus:  Goals Group:   The focus of this group is to help patients establish daily goals to achieve during treatment and discuss how the patient can incorporate goal setting into their daily lives to aide in recovery. Orientation:   The focus of this group is to educate the patient on the purpose and policies of crisis stabilization and provide a format to answer questions about their admission.  The group details unit policies and expectations of patients while admitted.    Participation Level:  Did Not Attend   Brett Wu 05/10/2024, 9:32 AM

## 2024-05-10 NOTE — Group Note (Signed)
 Recreation Therapy Group Note   Group Topic:Health and Wellness  Group Date: 05/10/2024 Start Time: 1005 End Time: 1022 Facilitators: Leontine Radman-McCall, LRT,CTRS Location: [address] Dayroom   Group Topic: Exercise/Wellness  Goal Area(s) Addresses:  Patient will verbalize benefit of exercise during group session. Patient will identify an exercise that can be completed post d/c. Patient will acknowledge benefits of exercise when used as a coping mechanism.   Behavioral Response:   Intervention: Music  Activity: LRT and patients were to complete 3 rounds of exercises. Each patient took turns leading the group in the exercises of their choosing. Patients could take breaks or get water  if needed.  Education: Physical Activity, Health and Wellness  Education Outcome: Acknowledges understanding/In group clarification offered/Needs additional education.    Affect/Mood: N/A   Participation Level: Did not attend    Clinical Observations/Individualized Feedback:      Plan: Continue to engage patient in RT group sessions 2-3x/week.   Ormond Lazo-McCall, LRT,CTRS  05/10/2024 1:46 PM

## 2024-05-10 NOTE — Group Note (Signed)
 Date:  05/10/2024 Time:  11:36 AM  Group Topic/Focus:  Emotional Education:   The focus of this group is to discuss what feelings/emotions are, and how they are experienced.    Participation Level:  Did Not Attend   Brett Wu 05/10/2024, 11:36 AM

## 2024-05-10 NOTE — Group Note (Signed)
 LCSW Group Therapy Note   Group Date: 05/10/2024 Start Time: 1100 End Time: 1200   Participation:  did not attend  Type of Therapy:  Group Therapy  Topic:  Healing Flames: Navigating Anger with Compassion  Title: Healing Flames: Navigating Anger with Compassion Objective:  Foster self-awareness and promote compassion toward oneself and others when dealing with anger.  Goals:  Help participants understand the underlying emotions and needs fueling anger. Provide coping strategies for healthier emotional expression and anger management.  Summary: This session explored anger as a volcano--an explosion driven by deeper feelings and unmet needs. Participants learned to identify anger triggers and underlying emotions, then practiced coping strategies like deep breathing, physical activity, and journaling. The group discussed healthy ways to manage anger before it escalates, using both personal reflection and shared experiences.  Therapeutic Modalities: Cognitive Behavioral Therapy (CBT): Challenging thoughts that fuel anger. Mindfulness: Increasing awareness of emotions and sensations.   Yuriana Gaal O Wilhemina Grall, LCSWA 05/10/2024  2:33 PM

## 2024-05-10 NOTE — Plan of Care (Signed)
   Problem: Activity: Goal: Sleeping patterns will improve Outcome: Progressing   Problem: Safety: Goal: Periods of time without injury will increase Outcome: Progressing

## 2024-05-10 NOTE — Progress Notes (Signed)
(  Sleep Hours) -10.75  (Any PRNs that were needed, meds refused, or side effects to meds)- Trazodone   (Any disturbances and when (visitation, over night)-none  (Concerns raised by the patient)- none , pt did not interact with clinical research associate , information received from another nurse he interacted with  (SI/HI/AVH)-denies

## 2024-05-10 NOTE — BHH Counselor (Addendum)
 Adult Comprehensive Assessment  Patient ID: Brett Wu, male   DOB: 1995-05-25, 28 y.o.   MRN: [id number]  Information Source: Chart Review   ATTEMPTED PSA ON 12/27, 12/28 AND 05/10/24. PT UNABLE TO COMPLETE DUE TO PSYCHOSIS. CHART REVIEW PREFORMED AND INFORMATION IS AS FOLLOWS:   Brett Wu is a 28 year old man involuntarily admitted to [facility] from [facility] Emergency Department at [facility] due to suicidal ideations and hallucinations.  Patient reported that he has been seeing weird stuff lately.  It was reported that patient has a history of schizophrenia diagnosis and methamphetamine use.  Patient's stressors and housing situation are unknown at this time.  Patient has family members, however it's unclear what their relationship to patient is (possibly brother) and what level of support they could provide.  At admission, patient's urinary drug screen tested positive for amphetamines.  Patient is working with [facility] ACT Team.  CSW will continue gathering information as patient continues to improve.  While here, Brett Wu can benefit from crisis stabilization, medication management, therapeutic milieu, and referrals for services.    Brett Wu, 05/10/2204 05/10/2024

## 2024-05-10 NOTE — Plan of Care (Signed)
 Isolated/stayed in bed all day. Didn't eat breakfast nor lunch. Denies SI/HI/paranoia/AVH. Very lethargic throughout the day.

## 2024-05-10 NOTE — BH IP Treatment Plan (Addendum)
 Interdisciplinary Treatment and Diagnostic Plan Update  05/10/2024 Time of Session: 11:15 AM  Brett Wu MRN: [id number]  Principal Diagnosis: Psychosis [facility])  Secondary Diagnoses: Principal Problem:   Psychosis (HCC)   Current Medications:  Current Facility-Administered Medications  Medication Dose Route Frequency Provider Last Rate Last Admin   acetaminophen  (TYLENOL ) tablet 650 mg  650 mg Oral Q6H PRN White, Patrice L, NP       alum & mag hydroxide-simeth (MAALOX/MYLANTA) 200-200-20 MG/5ML suspension 30 mL  30 mL Oral Q4H PRN White, Patrice L, NP       haloperidol  (HALDOL ) tablet 5 mg  5 mg Oral TID PRN White, Patrice L, NP       And   diphenhydrAMINE  (BENADRYL ) capsule 50 mg  50 mg Oral TID PRN White, Patrice L, NP       haloperidol  lactate (HALDOL ) injection 5 mg  5 mg Intramuscular TID PRN White, Patrice L, NP       And   diphenhydrAMINE  (BENADRYL ) injection 50 mg  50 mg Intramuscular TID PRN White, Patrice L, NP       And   LORazepam  (ATIVAN ) injection 2 mg  2 mg Intramuscular TID PRN White, Patrice L, NP       haloperidol  lactate (HALDOL ) injection 10 mg  10 mg Intramuscular TID PRN White, Patrice L, NP       And   diphenhydrAMINE  (BENADRYL ) injection 50 mg  50 mg Intramuscular TID PRN White, Patrice L, NP       And   LORazepam  (ATIVAN ) injection 2 mg  2 mg Intramuscular TID PRN White, Patrice L, NP       hydrOXYzine  (ATARAX ) tablet 25 mg  25 mg Oral TID PRN White, Patrice L, NP       magnesium  hydroxide (MILK OF MAGNESIA) suspension 30 mL  30 mL Oral Daily PRN White, Patrice L, NP       nicotine  (NICODERM CQ  - dosed in mg/24 hours) patch 14 mg  14 mg Transdermal Daily Mannie Ashley SAILOR, MD       OLANZapine  zydis (ZYPREXA ) disintegrating tablet 15 mg  15 mg Oral QHS [name], MD       traZODone  (DESYREL ) tablet 50 mg  50 mg Oral QHS PRN White, Patrice L, NP       PTA Medications: No medications prior to admission.    Patient Stressors:    Patient  Strengths:    Treatment Modalities: Medication Management, Group therapy, Case management,  1 to 1 session with clinician, Psychoeducation, Recreational therapy.   Physician Treatment Plan for Primary Diagnosis: Psychosis (HCC) Long Term Goal(s):     Short Term Goals: Ability to identify changes in lifestyle to reduce recurrence of condition will improve Ability to identify triggers associated with substance abuse/mental health issues will improve  Medication Management: Evaluate patient's response, side effects, and tolerance of medication regimen.  Therapeutic Interventions: 1 to 1 sessions, Unit Group sessions and Medication administration.  Evaluation of Outcomes: Not Progressing  Physician Treatment Plan for Secondary Diagnosis: Principal Problem:   Psychosis (HCC)  Long Term Goal(s):     Short Term Goals: Ability to identify changes in lifestyle to reduce recurrence of condition will improve Ability to identify triggers associated with substance abuse/mental health issues will improve     Medication Management: Evaluate patient's response, side effects, and tolerance of medication regimen.  Therapeutic Interventions: 1 to 1 sessions, Unit Group sessions and Medication administration.  Evaluation of Outcomes: Not Progressing  RN Treatment Plan for Primary Diagnosis: Psychosis (HCC) Long Term Goal(s): Knowledge of disease and therapeutic regimen to maintain health will improve  Short Term Goals: Ability to remain free from injury will improve, Ability to verbalize frustration and anger appropriately will improve, Ability to demonstrate self-control, Ability to participate in decision making will improve, Ability to verbalize feelings will improve, Ability to disclose and discuss suicidal ideas, Ability to identify and develop effective coping behaviors will improve, and Compliance with prescribed medications will improve  Medication Management: RN will administer  medications as ordered by provider, will assess and evaluate patient's response and provide education to patient for prescribed medication. RN will report any adverse and/or side effects to prescribing provider.  Therapeutic Interventions: 1 on 1 counseling sessions, Psychoeducation, Medication administration, Evaluate responses to treatment, Monitor vital signs and CBGs as ordered, Perform/monitor CIWA, COWS, AIMS and Fall Risk screenings as ordered, Perform wound care treatments as ordered.  Evaluation of Outcomes: Not Progressing   LCSW Treatment Plan for Primary Diagnosis: Psychosis (HCC) Long Term Goal(s): Safe transition to appropriate next level of care at discharge, Engage patient in therapeutic group addressing interpersonal concerns.  Short Term Goals: Engage patient in aftercare planning with referrals and resources, Increase social support, Increase ability to appropriately verbalize feelings, Increase emotional regulation, Facilitate acceptance of mental health diagnosis and concerns, Facilitate patient progression through stages of change regarding substance use diagnoses and concerns, Identify triggers associated with mental health/substance abuse issues, and Increase skills for wellness and recovery  Therapeutic Interventions: Assess for all discharge needs, 1 to 1 time with Social worker, Explore available resources and support systems, Assess for adequacy in community support network, Educate family and significant other(s) on suicide prevention, Complete Psychosocial Assessment, Interpersonal group therapy.  Evaluation of Outcomes: Not Progressing   Progress in Treatment: Attending groups: No. Participating in groups: No. Taking medication as prescribed: no medications have been scheduled for patient at this time. Toleration medication: N/A Family/Significant other contact made: No, will contact:  consents are pending Patient understands diagnosis: No. Discussing patient  identified problems/goals with staff: No. Medical problems stabilized or resolved: Yes. Denies suicidal/homicidal ideation: Yes. Issues/concerns per patient self-inventory: No.  New problem(s) identified:  No  New Short Term/Long Term Goal(s):   medication stabilization, elimination of SI thoughts, development of comprehensive mental wellness plan.    Patient Goals:  patient declined to participate in the treatment team meeting.  Patient had his head covered up and did not respond.  Discharge Plan or Barriers:  Patient recently admitted. CSW will continue to follow and assess for appropriate referrals and possible discharge planning.   Reason for Continuation of Hospitalization: Hallucinations Medication stabilization Suicidal ideation  Estimated Length of Stay:  5 - 7 days  Last 3 [place] Suicide Severity Risk Score: Flowsheet Row ED to Hosp-Admission (Current) [facility] Most recent reading at 05/07/2024  7:36 PM ED from 05/07/2024 in [facility] Emergency Department at [facility] Most recent reading at 05/07/2024  7:37 AM ED from 10/22/2021 in [facility] Emergency Department at [facility] Most recent reading at 10/22/2021  6:07 PM  C-SSRS RISK CATEGORY No Risk No Risk No Risk    Last PHQ 2/9 Scores:     No data to display          Scribe for Treatment Team: Aquanetta Schwarz O Litzy Dicker, LCSWA 05/10/2024 5:05 PM

## 2024-05-10 NOTE — BHH Counselor (Signed)
 Adult Comprehensive Assessment  Patient ID: Brett Wu, male   DOB: 12-Jul-1995, 28 y.o.   MRN: [id number]  THIRD ATTEMPT:   CSW attempted to speak with patient twice today (in the morning and afternoon) but he wouldn't respond.  He covered his head with a blanket.  One of Mental Health Technicians reported that he was declining to get up for food.   Liyat Faulkenberry O Diamonte Stavely, LCSWA 05/10/2024

## 2024-05-10 NOTE — BHH Group Notes (Signed)
 Patient did not attend the Social Work group.

## 2024-05-10 NOTE — BHH Group Notes (Signed)
 Patient did not attend the Mental Health Video group.

## 2024-05-10 NOTE — Group Note (Signed)
 Date:  05/10/2024 Time:  10:31 AM  Group Topic/Focus:  Recreational Therapy    Participation Level:  Did Not Attend   Brett Wu 05/10/2024, 10:31 AM

## 2024-05-10 NOTE — Plan of Care (Signed)
  Problem: Activity: Goal: Sleeping patterns will improve Outcome: Progressing   

## 2024-05-11 ENCOUNTER — Encounter (HOSPITAL_COMMUNITY): Payer: Self-pay | Admitting: Behavioral Health

## 2024-05-11 DIAGNOSIS — F19959 Other psychoactive substance use, unspecified with psychoactive substance-induced psychotic disorder, unspecified: Secondary | ICD-10-CM | POA: Diagnosis not present

## 2024-05-11 DIAGNOSIS — F29 Unspecified psychosis not due to a substance or known physiological condition: Secondary | ICD-10-CM | POA: Diagnosis not present

## 2024-05-11 NOTE — Group Note (Signed)
 Date:  05/11/2024 Time:  4:44 PM  Group Topic/Focus:  Emotional Education:   The focus of this group is to discuss what feelings/emotions are, and how they are experienced. Wellness Toolbox:   The focus of this group is to discuss various aspects of wellness, balancing those aspects and exploring ways to increase the ability to experience wellness.  Patients will create a wellness toolbox for use upon discharge.    Participation Level:  Did Not Attend   Brett Wu 05/11/2024, 4:44 PM

## 2024-05-11 NOTE — Plan of Care (Signed)
   Problem: Education: Goal: Emotional status will improve Outcome: Progressing Goal: Mental status will improve Outcome: Progressing   Problem: Activity: Goal: Interest or engagement in activities will improve Outcome: Progressing Goal: Sleeping patterns will improve Outcome: Progressing   Problem: Safety: Goal: Periods of time without injury will increase Outcome: Progressing

## 2024-05-11 NOTE — Progress Notes (Signed)
(  Sleep Hours) -10.25  (Any PRNs that were needed, meds refused, or side effects to meds)- Trazodone  50 mg  (Any disturbances and when (visitation, over night)-none  (Concerns raised by the patient)- Pt stated he would go to more groups and agreed to talk to the doctor tomorrow , pt wanted to apologize to writer for being disrespecful the other day . Pt stated I just want to feel better  (SI/HI/AVH)-denies

## 2024-05-11 NOTE — Progress Notes (Addendum)
 [facility] Inpatient Psychiatry Progress Note  Date: 05/11/2024 Patient: Brett Wu MRN: [id number]   ASSESSMENT:  Brett Wu is a 28 y.o. male  with a past psychiatric history of schizophrenia, methamphetamine use, and cannabis use disorder. Patient initially arrived to [facility] on 05/07/24 for increasing auditory hallucinations, delusions, and suicidal ideation and was admitted to [facility] under IVC on 05/07/24 for acute safety concerns and severe substance-induced psychosis or mood disturbances. No significant past medical history.   Patient is a 28 year old male with a past psychiatric history of methamphetamine use disorder, schizophrenia, cannabis use disorder presenting for increasing auditory hallucinations, delusions, and suicidal ideation. Differential diagnosis includes primary psychotic disorder vs substance-induced psychosis vs substance-induced mood disorder. Per chart review, patient has had a good response to Zyprexa  in the past, however had not been compliant with medications outpatient. Plan to continue Zyprexa  given past effectiveness. '  12/30: Patient was finally responsive to interview, albeit minimally.  Denied chest pain, shortness of breath, suicidal ideation and new headache.  Expressed desire to see his family -- reached out to both to brought [name], patient's peer support, as well as his brother listed as emergency contact: Received no answer to either.  Will continue Zyprexa  15 at bedtime for now.  PLAN:  # Primary psychotic disorder vs substance-induced psychosis -- Continue Zyprexa  Zydis 15 mg qhs   PRN's - Trazodone  50 mg at bedtime as needed for insomnia - Atarax  25 mg TID as needed for anxiety - Agitation Protocol: haldol  + ativan  + benadryl    2. Active Medical Issues   #Nicotine  withdrawal - Patient in need of nicotine  replacement; nicotine  patch 14 mg / 24 hours ordered. Smoking cessation encouraged   Other as needed  medications Tylenol  650 mg every 6 hours as needed for pain Mylanta 30 mL every 4 hours as needed for indigestion Milk of magnesia 30 mL daily as needed for constipation   The risks/benefits/side-effects/alternatives to the above medication(s) were discussed in detail with the patient and time was given for questions. The patient consents to medication trial. FDA black box warnings, if present, were discussed.   3. Safety and Monitoring: - Involuntary admission to inpatient psychiatric unit for safety, stabilization and treatment - Daily contact with patient to assess and evaluate symptoms and progress in treatment - Patient's case to be discussed in multi-disciplinary team meeting - Observation Level: q15 minute checks - Vital signs:  q12 hours - Precautions: suicide, elopement, and assault   4. Routine and other pertinent labs: EKG monitoring: QTc: 421 on 05/07/24   Metabolism / endocrine: BMI: Body mass index is 22.15 kg/m.   CBC: WBC 12 CMP: unremarkable UDS: pos for amphetamines Ethanol: <10 TSH: pending A1c: pending Lipid panel: pending HIV: negative RPR: pending   Risk Assessment: Patient continues to require inpatient hospitalization for safety and stabilization of acute psychosis.  Discharge Planning: Barriers to Discharge: resolution of psychosis Estimated Length of Stay: 5-14 days Predicted Discharge Location: group home  INTERVAL HISTORY: Zyprexa  10 --> 15 mg. No medication refusals. Received trazodone  50 mg at bedtime. Attended wrap-up group. Denied SI, HI, and AVH. Slept 10.75 hours.   Patient finally responsive today, asked about what?  when clinical research associate initiated interview.   replied no to questions concerning chest pain, shortness of breath, suicidal ideation and new headache.  Voluntarily lifted his head up from under his blanket so we could examine him.  Facial contusions remain stable. Asked how we could help him: repeated I  want to see my family several  times.   On collateral conversation with Tita Scot, brother [phone]): attempted call x2, did not answer.   On collateral conversation with [name], peer support [phone]): Called x1 did not answer, HIPAA compliant voicemail left.   Physical Examination:  Vitals and nursing note reviewed MSK: largely able to move limbs CN III: PEERLA  MENTAL STATUS EXAM:  Appearance: Patient is a white male with long, unkempt beard and hair with right periorbital contusion and a small scratch above R eyebrow,   Behavior: More cooperative   Attitude: Withdrawn  Speech: paucity of, short phrases, single-word answers, prosody intact  Mood: UTA  Affect: Flat  Thought Process: UTA to any meaningful extent   Thought Content: UTA  SI/HI: Denied SI  Perceptions: UTA  Judgement: Improving  Insight: Improving  Fund of Knowledge: UTA   Lab Results:  Admission on 05/07/2024  Component Date Value Ref Range Status   Neisseria Gonorrhea 05/07/2024 Negative   Final   Chlamydia 05/07/2024 Negative   Final   Comment 05/07/2024 Normal Reference Ranger Chlamydia - Negative   Final   Comment 05/07/2024 Normal Reference Range Neisseria Gonorrhea - Negative   Final   HIV Screen 4th Generation wRfx 05/07/2024 Non Reactive  Non Reactive Final   RPR Ser Ql 05/08/2024 NON REACTIVE  NON REACTIVE Final     Vitals: Blood pressure 117/83, pulse 80, temperature 98.3 F (36.8 C), temperature source Oral, resp. rate 20, height 5' 9 (1.753 m), weight 68 kg, SpO2 100%.    Odis Cleveland PGY-2, Psychiatry Residency  05/11/2024, 11:50 AM

## 2024-05-11 NOTE — Group Note (Signed)
 Date:  05/11/2024 Time:  10:33 AM  Group Topic/Focus:  Emotional Education:   The focus of this group is to discuss what feelings/emotions are, and how they are experienced.    Participation Level:  Did Not Attend   Brett Wu 05/11/2024, 10:33 AM

## 2024-05-11 NOTE — Group Note (Signed)
 Recreation Therapy Group Note   Group Topic:Self-Esteem  Group Date: 05/11/2024 Start Time: 1040 End Time: 1115 Facilitators: Elwyn Lowden-McCall, LRT,CTRS Location: [address] Dayroom   Group Topic: Self-Esteem  Goal Area(s) Addresses:  Patient will successfully identify positive attributes about themselves.  Patient will identify healthy ways to increase self-esteem. Patient will acknowledge benefit(s) of improved self-esteem.   Behavioral Response:   Intervention: Templates of blank picture frames; Markers  Activity: LRT and patients discussed how identity helps shape the different aspects of a persons life, such as how they think, describe or present yourself. Patients were to identify 4 identities that mean the most to them and draw a picture that corresponds with that identity. For example, drawing a horse for being an animal lover.  Education:  Self-Esteem, Discharge Planning  Education Outcome: Acknowledges education/In group clarification offered/Needs additional education   Affect/Mood: N/A   Participation Level: Did not attend    Clinical Observations/Individualized Feedback:     Plan: Continue to engage patient in RT group sessions 2-3x/week.   Brett Wu, LRT,CTRS 05/11/2024 1:56 PM

## 2024-05-11 NOTE — Group Note (Unsigned)
 Date:  05/11/2024 Time:  4:38 PM  Group Topic/Focus:  Emotional Education:   The focus of this group is to discuss what feelings/emotions are, and how they are experienced. Wellness Toolbox:   The focus of this group is to discuss various aspects of wellness, balancing those aspects and exploring ways to increase the ability to experience wellness.  Patients will create a wellness toolbox for use upon discharge.     Participation Level:  {BHH PARTICIPATION OZCZO:77735}  Participation Quality:  {BHH PARTICIPATION QUALITY:22265}  Affect:  {BHH AFFECT:22266}  Cognitive:  {BHH COGNITIVE:22267}  Insight: {BHH Insight2:20797}  Engagement in Group:  {BHH ENGAGEMENT IN HMNLE:77731}  Modes of Intervention:  {BHH MODES OF INTERVENTION:22269}  Additional Comments:  ***  Amandeep Nesmith N Marvena Tally 05/11/2024, 4:38 PM

## 2024-05-11 NOTE — Group Note (Signed)
 Date:  05/11/2024 Time:  9:43 AM  Group Topic/Focus:  Goals Group:   The focus of this group is to help patients establish daily goals to achieve during treatment and discuss how the patient can incorporate goal setting into their daily lives to aide in recovery. Orientation:   The focus of this group is to educate the patient on the purpose and policies of crisis stabilization and provide a format to answer questions about their admission.  The group details unit policies and expectations of patients while admitted.    Participation Level:  Did Not Attend   Brett Wu 05/11/2024, 9:43 AM

## 2024-05-11 NOTE — Group Note (Signed)
 Date:  05/11/2024 Time:  3:26 PM  Group Topic/Focus:  Pharmacy Group     Participation Level:  Did Not Attend  Logan LITTIE Molly 05/11/2024, 3:26 PM

## 2024-05-11 NOTE — Group Note (Deleted)
 Date:  05/11/2024 Time:  9:08 AM  Group Topic/Focus:  Goals Group:   The focus of this group is to help patients establish daily goals to achieve during treatment and discuss how the patient can incorporate goal setting into their daily lives to aide in recovery. Orientation:   The focus of this group is to educate the patient on the purpose and policies of crisis stabilization and provide a format to answer questions about their admission.  The group details unit policies and expectations of patients while admitted.     Participation Level:  {BHH PARTICIPATION OZCZO:77735}  Participation Quality:  {BHH PARTICIPATION QUALITY:22265}  Affect:  {BHH AFFECT:22266}  Cognitive:  {BHH COGNITIVE:22267}  Insight: {BHH Insight2:20797}  Engagement in Group:  {BHH ENGAGEMENT IN HMNLE:77731}  Modes of Intervention:  {BHH MODES OF INTERVENTION:22269}  Additional Comments:  ***  Kaniah Rizzolo N Theador Jezewski 05/11/2024, 9:08 AM

## 2024-05-11 NOTE — Plan of Care (Addendum)
 Pt observed in bed majority of this shift. Unwilling to engage with staff majority of this shift, covered up his head when approached. Finally woke up this evening post supper to talk to talk to staff. Per pt I just want to go home to my family. intervals without issues. Tolerates meals and fluids well. Safety checks maintained at Q 15 minutes   Problem: Safety: Goal: Periods of time without injury will increase Outcome: Progressing   Problem: Activity: Goal: Interest or engagement in activities will improve Outcome: Not Progressing

## 2024-05-11 NOTE — Group Note (Signed)
 Date:  05/11/2024 Time:  11:49 AM  Group Topic/Focus: Recreation Therapy Orientation:      Participation Level:  Did Not Attend   Dena LOISE Dull 05/11/2024, 11:49 AM

## 2024-05-11 NOTE — Group Note (Signed)
 Date:  05/11/2024 Time:  8:47 PM  Group Topic/Focus:  Wrap-Up Group:   The focus of this group is to help patients review their daily goal of treatment and discuss progress on daily workbooks.    Participation Level:  Minimal  Participation Quality:  Sharing  Affect:  Flat  Cognitive:  Appropriate  Insight: Limited  Engagement in Group:  Limited  Modes of Intervention:  Discussion and Socialization  Additional Comments:  Patient did not share much during wrap up group. Patient shared that he slept most of the day and felt drained most of the day.   Eward Mace 05/11/2024, 8:47 PM

## 2024-05-12 DIAGNOSIS — F19959 Other psychoactive substance use, unspecified with psychoactive substance-induced psychotic disorder, unspecified: Secondary | ICD-10-CM | POA: Diagnosis not present

## 2024-05-12 DIAGNOSIS — F29 Unspecified psychosis not due to a substance or known physiological condition: Secondary | ICD-10-CM | POA: Diagnosis not present

## 2024-05-12 LAB — TSH: TSH: 0.841 u[IU]/mL (ref 0.350–4.500)

## 2024-05-12 LAB — LIPID PANEL
Cholesterol: 142 mg/dL (ref 0–200)
HDL: 44 mg/dL
LDL Cholesterol: 82 mg/dL (ref 0–99)
Total CHOL/HDL Ratio: 3.2 ratio
Triglycerides: 83 mg/dL
VLDL: 17 mg/dL (ref 0–40)

## 2024-05-12 LAB — HEMOGLOBIN A1C
Hgb A1c MFr Bld: 5.7 % — ABNORMAL HIGH (ref 4.8–5.6)
Mean Plasma Glucose: 116.89 mg/dL

## 2024-05-12 LAB — HEPATITIS PANEL, ACUTE
HCV Ab: NONREACTIVE
Hep A IgM: NONREACTIVE
Hep B C IgM: NONREACTIVE
Hepatitis B Surface Ag: NONREACTIVE

## 2024-05-12 NOTE — Group Note (Signed)
 Recreation Therapy Group Note   Group Topic:Healthy Decision Making  Group Date: 05/12/2024 Start Time: 1023 End Time: 1051 Facilitators: Shristi Scheib-McCall, LRT,CTRS Location: [address] Dayroom   Group Topic: Decision Making, Problem Solving, Communication  Goal Area(s) Addresses:  Patient will effectively work with peer towards shared goal.  Patient will identify factors that guided their decision making.  Patient will pro-socially communicate ideas during group session.   Behavioral Response:   Intervention: Survival Scenario - pencil, paper  Activity:  Patients were given a scenario that they were going to be stranded [place] a [place] before being rescued. Writer tasked them with making a list of 15 things they would choose to bring with them for survival. The list of items was prioritized most important to least. Each patient would come up with their own list, then work together to create a new list of 15 items while in a group of 3-5 peers. LRT discussed each person's list and how it differed from others. The debrief included discussion of priorities, good decisions versus bad decisions, and how it is important to think before acting so we can make the best decision possible. LRT tied the concept of effective communication among group members to patient's support systems outside of the hospital and its benefit post discharge.  Education: Pharmacist, Community, Priorities, Support System, Discharge Planning    Education Outcome: Acknowledges education/In group clarification/Needs additional education   Affect/Mood: N/A   Participation Level: Did not attend    Clinical Observations/Individualized Feedback:      Plan: Continue to engage patient in RT group sessions 2-3x/week.   Brett Wu, LRT,CTRS 05/12/2024 2:14 PM

## 2024-05-12 NOTE — Group Note (Signed)
 Date:  05/12/2024 Time:  10:59 AM  Group Topic/Focus: Recreational Therapy Orientation:   Recreational Therapy    Participation Level:  Did Not Attend   Brett Wu 05/12/2024, 10:59 AM

## 2024-05-12 NOTE — Progress Notes (Signed)
 [facility] Inpatient Psychiatry Progress Note  Date: 05/12/2024 Patient: Brett Wu MRN: [id number]   ASSESSMENT:  Brett Wu is a 28 y.o. male  with a past psychiatric history of schizophrenia, methamphetamine use, and cannabis use disorder. Patient initially arrived to [facility] on 05/07/24 for increasing auditory hallucinations, delusions, and suicidal ideation and was admitted to [facility] under IVC on 05/07/24 for acute safety concerns and severe substance-induced psychosis or mood disturbances. No significant past medical history.   Patient is a 28 year old male with a past psychiatric history of methamphetamine use disorder, schizophrenia, cannabis use disorder presenting for increasing auditory hallucinations, delusions, and suicidal ideation. Differential diagnosis includes primary psychotic disorder vs substance-induced psychosis vs substance-induced mood disorder. Per chart review, patient has had a good response to Zyprexa  in the past, however had not been compliant with medications outpatient. Plan to continue Zyprexa  given past effectiveness. '  12/31: Patient was irritable but interacted at length with providers today, which represents an improvement in his state.  Finally consented to labs: Largely unremarkable.  Appears to be confused as to the exact circumstances of his admission to the hospital.  Repeatedly expressed desire to go home with family.  Called brother again today, to no effect.  No other numbers could be found in chart.  Patient either does not know or would not give medical team the number to his sister, with whom he wishes to return to live with.  Does have an ACT team in place, including a peer support counselor named Brittany, detailed and HIPAA compliant voice message left for this person yesterday after multiple calls.  Unfortunately, we may be anticipating a discharge to [facility] for this patient late this week/early next week.  PLAN:  # Primary  psychotic disorder vs substance-induced psychosis -- Continue Zyprexa  Zydis 15 mg qhs   PRN's - Trazodone  50 mg at bedtime as needed for insomnia - Atarax  25 mg TID as needed for anxiety - Agitation Protocol: haldol  + ativan  + benadryl    2. Active Medical Issues   #Nicotine  withdrawal - Patient in need of nicotine  replacement; nicotine  patch 14 mg / 24 hours ordered. Smoking cessation encouraged   Other as needed medications Tylenol  650 mg every 6 hours as needed for pain Mylanta 30 mL every 4 hours as needed for indigestion Milk of magnesia 30 mL daily as needed for constipation   The risks/benefits/side-effects/alternatives to the above medication(s) were discussed in detail with the patient and time was given for questions. The patient consents to medication trial. FDA black box warnings, if present, were discussed.   3. Safety and Monitoring: - Involuntary admission to inpatient psychiatric unit for safety, stabilization and treatment - Daily contact with patient to assess and evaluate symptoms and progress in treatment - Patient's case to be discussed in multi-disciplinary team meeting - Observation Level: q15 minute checks - Vital signs:  q12 hours - Precautions: suicide, elopement, and assault   4. Routine and other pertinent labs: EKG monitoring: QTc: 421 on 05/07/24   Metabolism / endocrine: BMI: Body mass index is 22.15 kg/m.   CBC: WBC 12 CMP: unremarkable UDS: pos for amphetamines Ethanol: <10 TSH: WNL A1c: 5.7% Lipid panel: WNL HIV: negative RPR: pending   Risk Assessment: Patient continues to require inpatient hospitalization for safety and stabilization of acute psychosis.  Discharge Planning: Barriers to Discharge: resolution of psychosis Estimated Length of Stay: 2-5 days Predicted Discharge Location: group home  INTERVAL HISTORY: Called collateral to no effect. Patient slept  10.25 hours. Patient stated he would go to more groups and agreed to  talk with the doctor tomorrow. Denied SI, HI and AVH. No medication refusals. Trasodone 50 mg x1.  After repeated prompting, patient eventually did respond to medical team's inquiries saying, this is all bullshit.  Most questions had to be repeated 5-7 times in order to evoke response.  Patient was able to say that he wanted to go home with his family.  Says that he does not quite remember how he incurred the head wound, noting that it was fast.  Perhaps mild paranoia, saying that he did not know if it was safe in the hospital.  When asked why this was so, patient said I do not know.  Denied suicidal ideation, homicidal ideation, auditory and visual hallucinations, chest pain, and shortness of breath.  Did endorse some right shoulder pain from a shot he received earlier in the emergency department, which may have been vaccine versus IM agitation medications.  Full range of motion.  On collateral conversation with Tita Scot, brother [phone]): attempted call x2, did not answer.   On collateral conversation with [name], peer support [phone]): Called x1 did not answer, HIPAA compliant voicemail left.   Physical Examination:  Vitals and nursing note reviewed MSK: largely able to move limbs HEENT: Well-healing right sided periorbital contusion, cut above right eye does not appear infectious at this time  MENTAL STATUS EXAM:  Appearance: Patient is a white male with long, unkempt beard and hair with right periorbital contusion and a small scratch above R eyebrow,   Behavior: More cooperative   Attitude: Withdrawn  Speech: paucity of, short phrases, single-word answers, prosody intact  Mood: UTA  Affect: Irritable  Thought Process: Relatively full directed and linear  Thought Content: Did not respond sufficiently to determine presence of paranoid content  SI/HI: Denied SI and HI  Perceptions: Denied  Judgement: Improving  Insight: Improving  Fund of Knowledge:  UTA   Lab Results:  Admission on 05/07/2024  Component Date Value Ref Range Status   Neisseria Gonorrhea 05/07/2024 Negative   Final   Chlamydia 05/07/2024 Negative   Final   Comment 05/07/2024 Normal Reference Ranger Chlamydia - Negative   Final   Comment 05/07/2024 Normal Reference Range Neisseria Gonorrhea - Negative   Final   HIV Screen 4th Generation wRfx 05/07/2024 Non Reactive  Non Reactive Final   RPR Ser Ql 05/08/2024 NON REACTIVE  NON REACTIVE Final   Hepatitis B Surface Ag 05/12/2024 NON REACTIVE  NON REACTIVE Final   HCV Ab 05/12/2024 NON REACTIVE  NON REACTIVE Final   Hep A IgM 05/12/2024 NON REACTIVE  NON REACTIVE Final   Hep B C IgM 05/12/2024 NON REACTIVE  NON REACTIVE Final   Hgb A1c MFr Bld 05/12/2024 5.7 (H)  4.8 - 5.6 % Final   Mean Plasma Glucose 05/12/2024 116.89  mg/dL Final   Cholesterol 87/68/7974 142  0 - 200 mg/dL Final   Triglycerides 87/68/7974 83  <150 mg/dL Final   HDL 87/68/7974 44  >40 mg/dL Final   Total CHOL/HDL Ratio 05/12/2024 3.2  RATIO Final   VLDL 05/12/2024 17  0 - 40 mg/dL Final   LDL Cholesterol 05/12/2024 82  0 - 99 mg/dL Final   TSH 87/68/7974 0.841  0.350 - 4.500 uIU/mL Final     Vitals: Blood pressure 117/83, pulse 80, temperature 98.3 F (36.8 C), temperature source Oral, resp. rate 20, height 5' 9 (1.753 m), weight 68 kg, SpO2 100%.  Odis Cleveland PGY-2, Psychiatry Residency  05/12/2024, 1:48 PM

## 2024-05-12 NOTE — Progress Notes (Signed)
" °   05/12/24 1300  Psych Admission Type (Psych Patients Only)  Admission Status Involuntary  Psychosocial Assessment  Patient Complaints Isolation  Eye Contact Avoids  Facial Expression Flat  Affect Flat  Speech Elective mutism  Interaction Guarded  Motor Activity Slow  Appearance/Hygiene Disheveled  Behavior Characteristics Unwilling to participate  Mood Preoccupied  Thought Process  Coherency Blocking  Content WDL  Delusions None reported or observed  Perception WDL  Hallucination None reported or observed  Judgment Impaired  Confusion None  Danger to Self  Current suicidal ideation? Denies  Danger to Others  Danger to Others None reported or observed    "

## 2024-05-12 NOTE — Group Note (Signed)
 Date:  05/12/2024 Time:  11:04 PM  Group Topic/Focus:  Wrap-Up Group:   The focus of this group is to help patients review their daily goal of treatment and discuss progress on daily workbooks.    Participation Level:  Did Not Attend  Participation Quality:  Did Not Attend  Affect:  Did Not Attend  Cognitive:  Did Not Attend   Insight: None  Engagement in Group:  Did Not Attend   Modes of Intervention:  Did Not Attend  Additional Comments:  Pt was encouraged to attend wrap up group but did not attend  Lonni Na 05/12/2024, 11:04 PM

## 2024-05-12 NOTE — Group Note (Signed)
 Date:  05/12/2024 Time:  10:27 AM  Group Topic/Focus: Why its important to talk about mental health video Identifying Needs:   The focus of this group is to help patients understand the importance of talking about mental health    Participation Level:  Did Not Attend   Brett Wu 05/12/2024, 10:27 AM

## 2024-05-12 NOTE — Group Note (Signed)
 Date:  05/12/2024 Time:  3:23 PM  Group Topic/Focus: Social Wellness  Wellness Toolbox:   The focus of this group is to discuss various aspects of wellness, balancing those aspects and exploring ways to increase the ability to experience wellness.  Patients will create a wellness toolbox for use upon discharge.    Participation Level:  Did Not Attend   Dena LOISE Dull 05/12/2024, 3:23 PM

## 2024-05-12 NOTE — Group Note (Signed)
 Date:  05/12/2024 Time:  9:21 AM  Group Topic/Focus:  Goals Group:   The focus of this group is to help patients establish daily goals to achieve during treatment and discuss how the patient can incorporate goal setting into their daily lives to aide in recovery. Orientation:   The focus of this group is to educate the patient on the purpose and policies of crisis stabilization and provide a format to answer questions about their admission.  The group details unit policies and expectations of patients while admitted.    Participation Level:  Did Not Attend   Dena LOISE Dull 05/12/2024, 9:21 AM

## 2024-05-12 NOTE — Plan of Care (Signed)
   Problem: Education: Goal: Emotional status will improve Outcome: Not Progressing Goal: Mental status will improve Outcome: Not Progressing

## 2024-05-12 NOTE — Group Note (Signed)
 Date:  05/12/2024 Time:  11:45 AM  Group Topic/Focus: Physical wellness Self Care:   The focus of this group is to help patients understand the importance of self-care in order to improve or restore emotional, physical, spiritual, interpersonal, and financial health. Wellness Toolbox:   The focus of this group is to discuss various aspects of wellness, balancing those aspects and exploring ways to increase the ability to experience wellness.  Patients will create a wellness toolbox for use upon discharge.    Participation Level:  Did Not Attend   Dena LOISE Dull 05/12/2024, 11:45 AM

## 2024-05-12 NOTE — Group Note (Signed)
 Date:  05/12/2024 Time:  3:31 PM  Group Topic/Focus:  Wellness Toolbox:   The focus of this group is to discuss various aspects of wellness, balancing those aspects and exploring ways to increase the ability to experience wellness.  Patients will create a wellness toolbox for use upon discharge.    Participation Level:  Did Not Attend   Almarie MALVA Lowers 05/12/2024, 3:31 PM

## 2024-05-13 DIAGNOSIS — F19959 Other psychoactive substance use, unspecified with psychoactive substance-induced psychotic disorder, unspecified: Secondary | ICD-10-CM | POA: Diagnosis not present

## 2024-05-13 DIAGNOSIS — F29 Unspecified psychosis not due to a substance or known physiological condition: Secondary | ICD-10-CM | POA: Diagnosis not present

## 2024-05-13 LAB — SYPHILIS: RPR W/REFLEX TO RPR TITER AND TREPONEMAL ANTIBODIES, TRADITIONAL SCREENING AND DIAGNOSIS ALGORITHM: RPR Ser Ql: NONREACTIVE

## 2024-05-13 NOTE — Progress Notes (Addendum)
 Conversation with patient:  Patient said that he doesn't have access to firearms.  Patient wasn't sure where he would go upon discharge.  CSW gave patient a list of - shelter resources  - substance use treatment programs.  CSW offered to send referrals to [facility] and [facility] but patient wasn't sure, and didn't give permission at this time.  CSW asked for permission to speak with [facility].  Patient didn't give permission to call them at this time, explaining that he wanted to call them first.  CSW provided patient with their phone number.  Patient stated that he has a sister.  He didn't give permission to call his sister at this time, explaining that he wanted to call her first.     Iyana Topor, ISRA[place] 05/13/2024

## 2024-05-13 NOTE — Progress Notes (Signed)
(  Sleep Hours) - 11.25  (Any PRNs that were needed, meds refused, or side effects to meds)- hydroxyzine , trazodone , tylenol  (Any disturbances and when (visitation, over night)- none reported (Concerns raised by the patient)- none reported (SI/HI/AVH)-Denies all  More talkative than normal to clinical research associate.

## 2024-05-13 NOTE — [facility] Group Notes (Signed)
 Adult Psychoeducational Group Note  Date:  05/13/2024 Time:  9:47 AM  Group Topic/Focus:  Goals Group:   The focus of this group is to help patients establish daily goals to achieve during treatment and discuss how the patient can incorporate goal setting into their daily lives to aide in recovery. Orientation:   The focus of this group is to educate the patient on the purpose and policies of crisis stabilization and provide a format to answer questions about their admission.  The group details unit policies and expectations of patients while admitted.  Participation Level:  Did not attend  Participation Quality:    Affect:    Cognitive:    Insight:   Engagement in Group:    Modes of Intervention:    Additional Comments:    Brett Wu O 05/13/2024, 9:47 AM

## 2024-05-13 NOTE — [facility] Group Notes (Signed)
 [facility] Group Notes:  (Nursing/MHT/Case Management/Adjunct)  Date:  05/13/2024  Time:  8:56 PM  Type of Therapy:  Wrap up group  Participation Level:  Active  Participation Quality:  Appropriate  Affect:  Appropriate  Cognitive:  Appropriate  Insight:  Appropriate  Engagement in Group:  Engaged  Modes of Intervention:  Education  Summary of Progress/Problems: Pt. Reports no goal. Rated day 5/10.  Grayce LITTIE Essex 05/13/2024, 8:56 PM

## 2024-05-13 NOTE — Plan of Care (Signed)

## 2024-05-13 NOTE — Progress Notes (Signed)
 [facility] Inpatient Psychiatry Progress Note  Date: 05/13/2024 Patient: Brett Wu MRN: [id number]   ASSESSMENT:  Brett Wu is a 29 y.o. male  with a past psychiatric history of schizophrenia, methamphetamine use, and cannabis use disorder. Patient initially arrived to [facility] on 05/07/24 for increasing auditory hallucinations, delusions, and suicidal ideation and was admitted to [facility] under IVC on 05/07/24 for acute safety concerns and severe substance-induced psychosis or mood disturbances. No significant past medical history.   Patient is a 29 year old male with a past psychiatric history of methamphetamine use disorder, schizophrenia, cannabis use disorder presenting for increasing auditory hallucinations, delusions, and suicidal ideation. Differential diagnosis includes primary psychotic disorder vs substance-induced psychosis vs substance-induced mood disorder. Per chart review, patient has had a good response to Zyprexa  in the past, however had not been compliant with medications outpatient. Plan to continue Zyprexa  given past effectiveness. '  1/1: Patient showing continued improvement, if irritable and demanding discharge.  Unable to conduct a thorough interview due to his irritation.  Still some residual paranoia.  Will need to be able to contact ACT team to ensure patient's return to baseline.  It does not seem as if he has a place to return to (asked to be discharged in sisters care.) Per sister, he cannot return there.  It does not seem as if she is an informed historian and appeared to struggle with simple questions regarding her brothers past psychiatric history, current living situation.  Will reattempt with ACT team tomorrow 1/2, as it appears closed on the holidays, to ensure patient safety.  No medication changes indicated otherwise at this time.  Likely appropriate for discharge 1/3 - 1/4.  PLAN:  # Primary psychotic disorder vs substance-induced  psychosis -- Continue Zyprexa  Zydis 15 mg qhs   PRN's - Trazodone  50 mg at bedtime as needed for insomnia - Atarax  25 mg TID as needed for anxiety - Agitation Protocol: haldol  + ativan  + benadryl    2. Active Medical Issues   #Nicotine  withdrawal - Patient in need of nicotine  replacement; nicotine  patch 14 mg / 24 hours ordered. Smoking cessation encouraged   Other as needed medications Tylenol  650 mg every 6 hours as needed for pain Mylanta 30 mL every 4 hours as needed for indigestion Milk of magnesia 30 mL daily as needed for constipation   The risks/benefits/side-effects/alternatives to the above medication(s) were discussed in detail with the patient and time was given for questions. The patient consents to medication trial. FDA black box warnings, if present, were discussed.   3. Safety and Monitoring: - Involuntary admission to inpatient psychiatric unit for safety, stabilization and treatment - Daily contact with patient to assess and evaluate symptoms and progress in treatment - Patient's case to be discussed in multi-disciplinary team meeting - Observation Level: q15 minute checks - Vital signs:  q12 hours - Precautions: suicide, elopement, and assault   4. Routine and other pertinent labs: EKG monitoring: QTc: 421 on 05/07/24   Metabolism / endocrine: BMI: Body mass index is 22.15 kg/m.   CBC: WBC 12 CMP: unremarkable UDS: pos for amphetamines Ethanol: <10 TSH: WNL A1c: 5.7% Lipid panel: WNL HIV: negative RPR: pending   Risk Assessment: Patient continues to require inpatient hospitalization for safety and stabilization of acute psychosis.  Discharge Planning: Barriers to Discharge: resolution of psychosis Estimated Length of Stay: 2-5 days Predicted Discharge Location: group home  INTERVAL HISTORY: No vitals. Refused nicotine . Accepted zyprexa . Tylenol  650 mg x1. Hydroxyzine  25 mg x1.  Trazodone  50 mg x1. Slept 11.25 hours. Denied concenrs. Denied SI,  HI, and AVH. More talkative.   Patient engages with interview better than yesterday, although he is more irritable.  Says that it is bullshit why he is here, same as yesterday.  Says that he spoke with his sister yesterday and would like to be discharged into her care.  Gave permission to reach out to her as well as her phone number.  Denied suicidal and homicidal ideation.  Denied auditory and visual hallucinations.  Denied having special powers.  When asked if this was a safe place, he said that he was not sure.  Encouraged patient to go to groups.  He then said why do I need to go to groups to get out of here?  Was later seen in groups.  On collateral conversation with Ms. Sylvain Hasten [phone]): Sister's speech is slow and latent, as if very tired. She doesn't know what is going on with him. Lives by himself. He has somewhere to go. Has an ACT team. He didn't seem weird. He wasn't sure he was able to go back to where he was staying. Did not vocalize to sister why this was. Can't live with sister.   Reached out again to patient's peer support specialist and to the [facility] ACT team -- neither of whom were available.  Physical Examination:  Vitals and nursing note reviewed MSK: largely able to move limbs HEENT: Well-healing right sided periorbital contusion, cut above right eye does not appear infectious at this time  MENTAL STATUS EXAM:  Appearance: Patient is a white male with long, unkempt beard and hair with right periorbital contusion and a small scratch above R eyebrow,   Behavior: More cooperative   Attitude: Irritated  Speech: Some latency, speaks fuller sentences, prosody intact  Mood: UTA  Affect: Irritable  Thought Process: Relatively full directed and linear  Thought Content: [place] he was not sure if he was safe in the hospital, perhaps some paranoia  SI/HI: Denied SI and HI  Perceptions: Denied  Judgement: Improving  Insight: Improving  Fund of  Knowledge: UTA   Lab Results:  Admission on 05/07/2024  Component Date Value Ref Range Status   Neisseria Gonorrhea 05/07/2024 Negative   Final   Chlamydia 05/07/2024 Negative   Final   Comment 05/07/2024 Normal Reference Ranger Chlamydia - Negative   Final   Comment 05/07/2024 Normal Reference Range Neisseria Gonorrhea - Negative   Final   HIV Screen 4th Generation wRfx 05/07/2024 Non Reactive  Non Reactive Final   RPR Ser Ql 05/08/2024 NON REACTIVE  NON REACTIVE Final   Hepatitis B Surface Ag 05/12/2024 NON REACTIVE  NON REACTIVE Final   HCV Ab 05/12/2024 NON REACTIVE  NON REACTIVE Final   Hep A IgM 05/12/2024 NON REACTIVE  NON REACTIVE Final   Hep B C IgM 05/12/2024 NON REACTIVE  NON REACTIVE Final   Hgb A1c MFr Bld 05/12/2024 5.7 (H)  4.8 - 5.6 % Final   Mean Plasma Glucose 05/12/2024 116.89  mg/dL Final   Cholesterol 87/68/7974 142  0 - 200 mg/dL Final   Triglycerides 87/68/7974 83  <150 mg/dL Final   HDL 87/68/7974 44  >40 mg/dL Final   Total CHOL/HDL Ratio 05/12/2024 3.2  RATIO Final   VLDL 05/12/2024 17  0 - 40 mg/dL Final   LDL Cholesterol 05/12/2024 82  0 - 99 mg/dL Final   RPR Ser Ql 87/68/7974 NON REACTIVE  NON REACTIVE Final  TSH 05/12/2024 0.841  0.350 - 4.500 uIU/mL Final     Vitals: Blood pressure 117/83, pulse 80, temperature 98.3 F (36.8 C), temperature source Oral, resp. rate 20, height 5' 9 (1.753 m), weight 68 kg, SpO2 100%.    Odis Cleveland PGY-2, Psychiatry Residency  05/13/2024, 11:16 AM

## 2024-05-13 NOTE — [facility] Group Notes (Signed)
 Adult Psychoeducational Group Note  Date:  05/13/2024 Time:  4:25 PM  Group Topic/Focus: Physical Wellness  Participation Level:  Did Not Attend  Participation Quality:    Affect:    Cognitive:    Insight:   Engagement in Group:    Modes of Intervention:    Additional Comments:    Kris No O 05/13/2024, 4:25 PM

## 2024-05-13 NOTE — [facility] Group Notes (Signed)
 Adult Psychoeducational Group Note  Date:  05/13/2024 Time:  4:39 PM  Group Topic/Focus: Occupational Therapy  Participation Level:  Active  Participation Quality:    Affect:    Cognitive:    Insight:   Engagement in Group:    Modes of Intervention:    Additional Comments:  Pt attended Occupational Therapy  Sanye Ledesma O 05/13/2024, 4:39 PM

## 2024-05-13 NOTE — Group Note (Signed)
 Occupational Therapy Group Note  Group Topic:Coping Skills  Group Date: 05/13/2024 Start Time: 1530 End Time: 1600 Facilitators: Dot [place] MATSU, OT   Group Description: Group encouraged increased engagement and participation through discussion and activity focused on Coping Ahead. Patients were split up into teams and selected a card from a stack of positive coping strategies. Patients were instructed to act out/charade the coping skill for other peers to guess and receive points for their team. Discussion followed with a focus on identifying additional positive coping strategies and patients shared how they were going to cope ahead over the weekend while continuing hospitalization stay.  Therapeutic Goal(s): Identify positive vs negative coping strategies. Identify coping skills to be used during hospitalization vs coping skills outside of hospital/at home Increase participation in therapeutic group environment and promote engagement in treatment   Participation Level: Engaged   Participation Quality: Independent   Behavior: Appropriate   Speech/Thought Process: Relevant   Affect/Mood: Appropriate   Insight: Fair   Judgement: Fair      Modes of Intervention: Education  Patient Response to Interventions:  Attentive   Plan: Continue to engage patient in OT groups 2 - 3x/week.  05/13/2024  [place] MATSU Dot, OT  Promise Weldin, OT

## 2024-05-13 NOTE — [facility] Group Notes (Signed)
 Adult Psychoeducational Group Note  Date:  05/13/2024 Time:  10:38 AM  Group Topic/Focus: Rec. Therapy  Participation Level:  Active  Participation Quality:  Appropriate and Attentive  Affect:  Appropriate  Cognitive:  Alert and Appropriate  Insight: Appropriate and Good  Engagement in Group:  Engaged  Modes of Intervention:  Activity  Additional Comments:   Brett Wu 05/13/2024, 10:38 AM

## 2024-05-13 NOTE — Group Note (Signed)
 Recreation Therapy Group Note   Group Topic:Problem Solving  Group Date: 05/13/2024 Start Time: 1009 End Time: 1029 Facilitators: Damari Suastegui-McCall, LRT,CTRS Location: 500 [address] Dayroom   Group Topic: Communication, Team Building, Problem Solving  Goal Area(s) Addresses:  Patient will effectively work with peer towards shared goal.  Patient will identify skills used to make activity successful.  Patient will identify how skills used during activity can be used to reach post d/c goals.   Behavioral Response: Active  Intervention: STEM Activity  Activity: Landing Pad. In teams of 3-5, patients were given 12 plastic drinking straws and an equal length of masking tape. Using the materials provided, patients were asked to build a landing pad to catch a golf ball dropped from approximately 5 feet in the air. All materials were required to be used by the team in their design. LRT facilitated post-activity discussion.  Education: Pharmacist, Community, Scientist, Physiological, Discharge Planning   Education Outcome: Acknowledges education/In group clarification offered/Needs additional education.    Affect/Mood: Appropriate   Participation Level: Active   Participation Quality: Independent   Behavior: Appropriate   Speech/Thought Process: Coherent   Insight: Fair   Judgement: Fair    Modes of Intervention: STEM Activity   Patient Response to Interventions:  Engaged   Education Outcome:  In group clarification offered    Clinical Observations/Individualized Feedback: Pt worked well with peer. Pt seemed struggle a little with understanding the full concept of what to do. LRT had to reinforce several times what needed to be done. Pt only created a little circle with one straw. Pt and peer never figured how to use the remaining straws and tape.      Plan: Continue to engage patient in RT group sessions 2-3x/week.   Ollen Rao-McCall, LRT,CTRS 05/13/2024 12:41 PM

## 2024-05-13 NOTE — Plan of Care (Signed)

## 2024-05-14 ENCOUNTER — Encounter (HOSPITAL_COMMUNITY): Payer: Self-pay

## 2024-05-14 DIAGNOSIS — F29 Unspecified psychosis not due to a substance or known physiological condition: Secondary | ICD-10-CM | POA: Diagnosis not present

## 2024-05-14 DIAGNOSIS — F19959 Other psychoactive substance use, unspecified with psychoactive substance-induced psychotic disorder, unspecified: Secondary | ICD-10-CM | POA: Diagnosis not present

## 2024-05-14 NOTE — Progress Notes (Signed)
 [facility] Inpatient Psychiatry Progress Note  Date: 05/14/2024 Patient: Brett Wu MRN: [id number]   ASSESSMENT:  Brett Wu is a 29 y.o. male  with a past psychiatric history of schizophrenia, methamphetamine use, and cannabis use disorder. Patient initially arrived to [facility] on 05/07/24 for increasing auditory hallucinations, delusions, and suicidal ideation and was admitted to [facility] under IVC on 05/07/24 for acute safety concerns and severe substance-induced psychosis or mood disturbances. No significant past medical history.   Patient is a 29 year old male with a past psychiatric history of methamphetamine use disorder, schizophrenia, cannabis use disorder presenting for increasing auditory hallucinations, delusions, and suicidal ideation. Differential diagnosis includes primary psychotic disorder vs substance-induced psychosis vs substance-induced mood disorder. Per chart review, patient has had a good response to Zyprexa  in the past, however had not been compliant with medications outpatient. Plan to continue Zyprexa  given past effectiveness. '  1/2: Patient refused to interact with team at all today.  This represents a significant setback.  Given he attended group yesterday and has had no behavioral issues on the unit, we will have him stepdown to 400 hall, where there are more opportunities for stimulatory activities.  Otherwise no medication changes indicated at this time.  Reached out to [facility], was informed that there was nobody I could speak to who knew Brett Wu at the time.  Left my cell phone with them.  PLAN:  # Primary psychotic disorder vs substance-induced psychosis -- Continue Zyprexa  Zydis 15 mg qhs   PRN's - Trazodone  50 mg at bedtime as needed for insomnia - Atarax  25 mg TID as needed for anxiety - Agitation Protocol: haldol  + ativan  + benadryl    2. Active Medical Issues   #Nicotine  withdrawal - Patient in need of  nicotine  replacement; nicotine  patch 14 mg / 24 hours ordered. Smoking cessation encouraged   Other as needed medications Tylenol  650 mg every 6 hours as needed for pain Mylanta 30 mL every 4 hours as needed for indigestion Milk of magnesia 30 mL daily as needed for constipation   The risks/benefits/side-effects/alternatives to the above medication(s) were discussed in detail with the patient and time was given for questions. The patient consents to medication trial. FDA black box warnings, if present, were discussed.   3. Safety and Monitoring: - Involuntary admission to inpatient psychiatric unit for safety, stabilization and treatment - Daily contact with patient to assess and evaluate symptoms and progress in treatment - Patient's case to be discussed in multi-disciplinary team meeting - Observation Level: q15 minute checks - Vital signs:  q12 hours - Precautions: suicide, elopement, and assault   4. Routine and other pertinent labs: EKG monitoring: QTc: 421 on 05/07/24   Metabolism / endocrine: BMI: Body mass index is 22.15 kg/m.   CBC: WBC 12 CMP: unremarkable UDS: pos for amphetamines Ethanol: <10 TSH: WNL A1c: 5.7% Lipid panel: WNL HIV: negative RPR: pending   Risk Assessment: Patient continues to require inpatient hospitalization for safety and stabilization of acute psychosis.  Discharge Planning: Barriers to Discharge: resolution of psychosis Estimated Length of Stay: 3 to 6 days Predicted Discharge Location: group home  INTERVAL HISTORY: 108/70. HR 67. No medication refusals. Tylenol  650 mg x1. Hydroxyzine  25 mg x1. Trazodone  50 mg x1. Slept 6.75 hours. Denied concerns. Denied SI, HI and AVH. Did not allow for social work to call [facility] -- he wanted to call them first.  Patient refused to interact with medical team at all.  Reached out again  to patient's peer support specialist and to the [facility] ACT team -- neither of whom  were available.  Physical Examination:  Vitals and nursing note reviewed MSK: largely able to move limbs HEENT: Well-healing right sided periorbital contusion, cut above right eye does not appear infectious at this time  MENTAL STATUS EXAM:  Appearance: Patient is a white male with long, unkempt beard and hair with right periorbital contusion and a small scratch above R eyebrow,   Behavior: Uncooperative   Attitude: Irritated  Speech: UTA  Mood: UTA  Affect: Irritable  Thought Process: UTA  Thought UTA  SI/HI: UTA  Perceptions: UTA  Judgement: Poor  Insight: UTA  Fund of Knowledge: UTA   Lab Results:  Admission on 05/07/2024  Component Date Value Ref Range Status   Neisseria Gonorrhea 05/07/2024 Negative   Final   Chlamydia 05/07/2024 Negative   Final   Comment 05/07/2024 Normal Reference Ranger Chlamydia - Negative   Final   Comment 05/07/2024 Normal Reference Range Neisseria Gonorrhea - Negative   Final   HIV Screen 4th Generation wRfx 05/07/2024 Non Reactive  Non Reactive Final   RPR Ser Ql 05/08/2024 NON REACTIVE  NON REACTIVE Final   Hepatitis B Surface Ag 05/12/2024 NON REACTIVE  NON REACTIVE Final   HCV Ab 05/12/2024 NON REACTIVE  NON REACTIVE Final   Hep A IgM 05/12/2024 NON REACTIVE  NON REACTIVE Final   Hep B C IgM 05/12/2024 NON REACTIVE  NON REACTIVE Final   Hgb A1c MFr Bld 05/12/2024 5.7 (H)  4.8 - 5.6 % Final   Mean Plasma Glucose 05/12/2024 116.89  mg/dL Final   Cholesterol 87/68/7974 142  0 - 200 mg/dL Final   Triglycerides 87/68/7974 83  <150 mg/dL Final   HDL 87/68/7974 44  >40 mg/dL Final   Total CHOL/HDL Ratio 05/12/2024 3.2  RATIO Final   VLDL 05/12/2024 17  0 - 40 mg/dL Final   LDL Cholesterol 05/12/2024 82  0 - 99 mg/dL Final   RPR Ser Ql 87/68/7974 NON REACTIVE  NON REACTIVE Final   TSH 05/12/2024 0.841  0.350 - 4.500 uIU/mL Final     Vitals: Blood pressure 108/70, pulse 67, temperature 98.3 F (36.8 C), temperature source Oral, resp. rate 20,  height 5' 9 (1.753 m), weight 68 kg, SpO2 100%.    Odis Cleveland PGY-2, Psychiatry Residency  05/14/2024, 1:30 PM

## 2024-05-14 NOTE — Group Note (Signed)
 Date:  05/14/2024 Time:  5:04 PM  Group Topic/Focus:  Self Care:   The focus of this group is to help patients understand the importance of self-care and sleep hygiene in order to improve or restore emotional, physical, spiritual, interpersonal, and financial health.    Additional Comments:  patient did not attend   Powell JAYSON Sharps 05/14/2024, 5:04 PM

## 2024-05-14 NOTE — Plan of Care (Cosign Needed)
 Per [facility]: Patient has been living by himself in an apartment for a few years. Started decompensating two months ago, more reclusive. Is a month behind on his rent but per ACTT can likely go back there and is not evicted. Can take care of himself at baseline -- implying that current state is not typical. Will re-contact come Monday.

## 2024-05-14 NOTE — Progress Notes (Signed)
 Conversation with patient:  CSW asked patient if they could send a referral to [facility] and [facility] (inpatient substance use treatment programs) but he wasn't sure.    CSW asked him if she could speak with [facility] but he wasn't sure.  Keyron Pokorski, LCSWA 05/15/2023

## 2024-05-14 NOTE — BH IP Treatment Plan (Signed)
 Interdisciplinary Treatment and Diagnostic Plan Update  05/14/2024 Time of Session: 11:45 AM - UPDATE Brett Wu MRN: [id number]  Principal Diagnosis: Psychosis (HCC)  Secondary Diagnoses: Principal Problem:   Psychosis (HCC)   Current Medications:  Current Facility-Administered Medications  Medication Dose Route Frequency Provider Last Rate Last Admin   acetaminophen  (TYLENOL ) tablet 650 mg  650 mg Oral Q6H PRN White, Patrice L, NP   650 mg at 05/13/24 2153   alum & mag hydroxide-simeth (MAALOX/MYLANTA) 200-200-20 MG/5ML suspension 30 mL  30 mL Oral Q4H PRN White, Patrice L, NP       haloperidol  (HALDOL ) tablet 5 mg  5 mg Oral TID PRN White, Patrice L, NP       And   diphenhydrAMINE  (BENADRYL ) capsule 50 mg  50 mg Oral TID PRN White, Patrice L, NP       haloperidol  lactate (HALDOL ) injection 5 mg  5 mg Intramuscular TID PRN White, Patrice L, NP       And   diphenhydrAMINE  (BENADRYL ) injection 50 mg  50 mg Intramuscular TID PRN White, Patrice L, NP       And   LORazepam  (ATIVAN ) injection 2 mg  2 mg Intramuscular TID PRN White, Patrice L, NP       haloperidol  lactate (HALDOL ) injection 10 mg  10 mg Intramuscular TID PRN White, Patrice L, NP       And   diphenhydrAMINE  (BENADRYL ) injection 50 mg  50 mg Intramuscular TID PRN White, Patrice L, NP       And   LORazepam  (ATIVAN ) injection 2 mg  2 mg Intramuscular TID PRN White, Patrice L, NP       hydrOXYzine  (ATARAX ) tablet 25 mg  25 mg Oral TID PRN White, Patrice L, NP   25 mg at 05/13/24 2154   magnesium  hydroxide (MILK OF MAGNESIA) suspension 30 mL  30 mL Oral Daily PRN White, Patrice L, NP       OLANZapine  zydis (ZYPREXA ) disintegrating tablet 15 mg  15 mg Oral QHS [name], MD   15 mg at 05/13/24 2154   traZODone  (DESYREL ) tablet 50 mg  50 mg Oral QHS PRN White, Patrice L, NP   50 mg at 05/13/24 2154   PTA Medications: No medications prior to admission.    Patient Stressors:    Patient Strengths:    Treatment  Modalities: Medication Management, Group therapy, Case management,  1 to 1 session with clinician, Psychoeducation, Recreational therapy.   Physician Treatment Plan for Primary Diagnosis: Psychosis (HCC) Long Term Goal(s):     Short Term Goals: Ability to identify changes in lifestyle to reduce recurrence of condition will improve Ability to identify triggers associated with substance abuse/mental health issues will improve  Medication Management: Evaluate patient's response, side effects, and tolerance of medication regimen.  Therapeutic Interventions: 1 to 1 sessions, Unit Group sessions and Medication administration.  Evaluation of Outcomes: Progressing  Physician Treatment Plan for Secondary Diagnosis: Principal Problem:   Psychosis (HCC)  Long Term Goal(s):     Short Term Goals: Ability to identify changes in lifestyle to reduce recurrence of condition will improve Ability to identify triggers associated with substance abuse/mental health issues will improve     Medication Management: Evaluate patient's response, side effects, and tolerance of medication regimen.  Therapeutic Interventions: 1 to 1 sessions, Unit Group sessions and Medication administration.  Evaluation of Outcomes: Progressing   RN Treatment Plan for Primary Diagnosis: Psychosis (HCC) Long Term Goal(s): Knowledge of disease and  therapeutic regimen to maintain health will improve  Short Term Goals: Ability to remain free from injury will improve, Ability to verbalize frustration and anger appropriately will improve, Ability to verbalize feelings will improve, and Ability to disclose and discuss suicidal ideas  Medication Management: RN will administer medications as ordered by provider, will assess and evaluate patient's response and provide education to patient for prescribed medication. RN will report any adverse and/or side effects to prescribing provider.  Therapeutic Interventions: 1 on 1 counseling  sessions, Psychoeducation, Medication administration, Evaluate responses to treatment, Monitor vital signs and CBGs as ordered, Perform/monitor CIWA, COWS, AIMS and Fall Risk screenings as ordered, Perform wound care treatments as ordered.  Evaluation of Outcomes: Progressing   LCSW Treatment Plan for Primary Diagnosis: Psychosis (HCC) Long Term Goal(s): Safe transition to appropriate next level of care at discharge, Engage patient in therapeutic group addressing interpersonal concerns.  Short Term Goals: Engage patient in aftercare planning with referrals and resources, Increase ability to appropriately verbalize feelings, Facilitate acceptance of mental health diagnosis and concerns, and Identify triggers associated with mental health/substance abuse issues  Therapeutic Interventions: Assess for all discharge needs, 1 to 1 time with Social worker, Explore available resources and support systems, Assess for adequacy in community support network, Educate family and significant other(s) on suicide prevention, Complete Psychosocial Assessment, Interpersonal group therapy.  Evaluation of Outcomes: Progressing   Progress in Treatment: Attending groups: attended some groups Participating in groups: Yes Taking medication as prescribed: no medications have been scheduled for patient at this time. Toleration medication: N/A Family/Significant other contact made: No, will contact:  consents are pending Patient understands diagnosis: No. Discussing patient identified problems/goals with staff: No. Medical problems stabilized or resolved: Yes. Denies suicidal/homicidal ideation: Yes. Issues/concerns per patient self-inventory: No.   New problem(s) identified:  No   New Short Term/Long Term Goal(s):    medication stabilization, elimination of SI thoughts, development of comprehensive mental wellness plan.    Patient Goals:  patient declined to participate in the treatment team meeting.  Patient  had his head covered up and did not respond.   Discharge Plan or Barriers:  Patient recently admitted. CSW will continue to follow and assess for appropriate referrals and possible discharge planning.    Reason for Continuation of Hospitalization: Hallucinations Medication stabilization Suicidal ideation   Estimated Length of Stay:  4 - 6 days  Last 3 [place] Suicide Severity Risk Score: Flowsheet Row ED to Hosp-Admission (Current) [facility] Most recent reading at 05/07/2024  7:36 PM ED from 05/07/2024 in [facility] Emergency Department at [facility] Most recent reading at 05/07/2024  7:37 AM ED from 10/22/2021 in [facility] Emergency Department at [facility] Most recent reading at 10/22/2021  6:07 PM  C-SSRS RISK CATEGORY No Risk No Risk No Risk    Last PHQ 2/9 Scores:     No data to display          Scribe for Treatment Team: Dail Meece O Rollande Thursby, LCSWA 05/14/2024 6:48 PM

## 2024-05-14 NOTE — Group Note (Signed)
 Date:  05/14/2024 Time:  9:02 PM  Group Topic/Focus:  Wrap-Up Group:   The focus of this group is to help patients review their daily goal of treatment and discuss progress on daily workbooks.    Participation Level: Additional Comments:  Pt was invited to attend group however the Pt declined.   Brett Wu [place] 05/14/2024, 9:02 PM

## 2024-05-14 NOTE — Group Note (Signed)
 Date:  05/14/2024 Time:  2:39 PM  Group Topic/Focus: Music Wellness Group  Patients participated in a guess the song activity using appropriate music played from the MHTs phone. This challenge promoted critical thinking and memory recall while encouraging active participation. Patients were engaged through singing along, listening closely, and interacting with peers, which supported socialization, mood elevation, and overall cognitive engagement.    Participation Level:  Did Not Attend  Additional Comments:  Pt did not attend this group  Kristi HERO [facility] 05/14/2024, 2:39 PM

## 2024-05-14 NOTE — Plan of Care (Signed)
   Problem: Education: Goal: Knowledge of [place] [facility] Education information/materials will improve Outcome: Progressing Goal: Emotional status will improve Outcome: Progressing Goal: Mental status will improve Outcome: Progressing Goal: Verbalization of understanding the information provided will improve Outcome: Progressing

## 2024-05-14 NOTE — BHH Group Notes (Signed)
 Adult Psychoeducational Group Note  Date:  05/14/2024 Time:  10:09 AM  Group Topic/Focus:  Goals Group:   The focus of this group is to help patients establish daily goals to achieve during treatment and discuss how the patient can incorporate goal setting into their daily lives to aide in recovery. Orientation:   The focus of this group is to educate the patient on the purpose and policies of crisis stabilization and provide a format to answer questions about their admission.  The group details unit policies and expectations of patients while admitted.  Participation Level:  Did Not Attend  Participation Quality:    Affect:    Cognitive:    Insight:   Engagement in Group:    Modes of Intervention:    Additional Comments:    Brett Wu 05/14/2024, 10:09 AM

## 2024-05-14 NOTE — Progress Notes (Signed)
(  Sleep Hours) - 6.75 (Any PRNs that were needed, meds refused, or side effects to meds)- tylenol , hydroxyzine , trazodone  (Any disturbances and when (visitation, over night)- none reported (Concerns raised by the patient)- none reported (SI/HI/AVH)- Denies all  Uneventful night

## 2024-05-15 DIAGNOSIS — F19959 Other psychoactive substance use, unspecified with psychoactive substance-induced psychotic disorder, unspecified: Secondary | ICD-10-CM | POA: Diagnosis not present

## 2024-05-15 DIAGNOSIS — F29 Unspecified psychosis not due to a substance or known physiological condition: Secondary | ICD-10-CM | POA: Diagnosis not present

## 2024-05-15 MED ORDER — FLUOXETINE HCL 20 MG PO CAPS
20.0000 mg | ORAL_CAPSULE | Freq: Every day | ORAL | Status: DC
  Administered 2024-05-15 – 2024-05-20 (×6): 20 mg via ORAL
  Filled 2024-05-15 (×6): qty 1

## 2024-05-15 MED ORDER — OLANZAPINE 10 MG PO TBDP
20.0000 mg | ORAL_TABLET | Freq: Every day | ORAL | Status: DC
  Administered 2024-05-15 – 2024-05-16 (×2): 20 mg via ORAL
  Filled 2024-05-15 (×2): qty 2

## 2024-05-15 NOTE — Plan of Care (Signed)

## 2024-05-15 NOTE — Progress Notes (Signed)
 [facility] Inpatient Psychiatry Progress Note  Date: 05/15/2024 Patient: Brett Wu MRN: [id number]   ASSESSMENT:  Brett Wu is a 29 y.o. male  with a past psychiatric history of schizophrenia, methamphetamine use, and cannabis use disorder. Patient initially arrived to [facility] on 05/07/24 for increasing auditory hallucinations, delusions, and suicidal ideation and was admitted to [facility] under IVC on 05/07/24 for acute safety concerns and severe substance-induced psychosis or mood disturbances. No significant past medical history.   Patient is a 29 year old male with a past psychiatric history of methamphetamine use disorder, schizophrenia, cannabis use disorder presenting for increasing auditory hallucinations, delusions, and suicidal ideation. Differential diagnosis includes primary psychotic disorder vs substance-induced psychosis vs substance-induced mood disorder. Per chart review, patient has had a good response to Zyprexa  in the past, however had not been compliant with medications outpatient. Plan to continue Zyprexa  given past effectiveness. '  1/3: Patient again refused to interact with medical team.  Did not appear catatonic, good movement, spoke to team.  Given ACT team's description of patient's baseline, will increase his current olanzapine  to 20 mg nightly and start Prozac  20 mg for depressed affect.  Will need to reach out to ACT team again on Monday, as they informed this writer that they would be unavailable to speak with over the weekend.  PLAN:  # Primary psychotic disorder vs substance-induced psychosis -- Increase Zyprexa  Zydis 15 ? 20 mg at bedtime - Start Prozac  20 mg daily for suspected depressed mood.   PRN's - Trazodone  50 mg at bedtime as needed for insomnia - Atarax  25 mg TID as needed for anxiety - Agitation Protocol: haldol  + ativan  + benadryl    2. Active Medical Issues   #Nicotine  withdrawal - Patient in need of nicotine   replacement; nicotine  patch 14 mg / 24 hours ordered. Smoking cessation encouraged   Other as needed medications Tylenol  650 mg every 6 hours as needed for pain Mylanta 30 mL every 4 hours as needed for indigestion Milk of magnesia 30 mL daily as needed for constipation   The risks/benefits/side-effects/alternatives to the above medication(s) were discussed in detail with the patient and time was given for questions. The patient consents to medication trial. FDA black box warnings, if present, were discussed.   3. Safety and Monitoring: - Involuntary admission to inpatient psychiatric unit for safety, stabilization and treatment - Daily contact with patient to assess and evaluate symptoms and progress in treatment - Patient's case to be discussed in multi-disciplinary team meeting - Observation Level: q15 minute checks - Vital signs:  q12 hours - Precautions: suicide, elopement, and assault   4. Routine and other pertinent labs: EKG monitoring: QTc: 421 on 05/07/24   Metabolism / endocrine: BMI: Body mass index is 22.15 kg/m.   CBC: WBC 12 CMP: unremarkable UDS: pos for amphetamines Ethanol: <10 TSH: WNL A1c: 5.7% Lipid panel: WNL HIV: negative RPR: pending   Risk Assessment: Patient continues to require inpatient hospitalization for safety and stabilization of acute psychosis.  Discharge Planning: Barriers to Discharge: resolution of psychosis Estimated Length of Stay: 3 to 6 days Predicted Discharge Location: group home  INTERVAL HISTORY: Slept 9.5 hours. Tylenol  650 mg. Zyprexa  15 mg at bedtime.  Isolative during group.  Denied SI, HI, AVH.  Patient again refused to interact with medical team, laying in bed.  Discussed with patient that, per conversation with patient's ACT team, he likely has an apartment to go back to and that he is not yet affected.  Did not answer questions regarding suicidal ideation, homicidal ideation, auditory visual hallucinations, somatic  symptomatology, and medication side effects.  When informed that a physical exam would be done, patient said this is bullshit and raised the blanket over his head.  Reached out again to patient's peer support specialist and to the [facility] ACT team -- neither of whom were available.  Physical Examination:  Vitals and nursing note reviewed MSK: largely able to move limbs HEENT: Well-healing right sided periorbital contusion, cut above right eye does not appear infectious at this time  MENTAL STATUS EXAM:  Appearance: Patient is a white male with long, unkempt beard and hair with right periorbital contusion and a small scratch above R eyebrow, well-healing  Behavior: Uncooperative   Attitude: Irritated  Speech: UTA  Mood: UTA  Affect: Irritable  Thought Process: UTA  Thought UTA  SI/HI: UTA  Perceptions: UTA  Judgement: Poor  Insight: UTA  Fund of Knowledge: UTA   Lab Results:  Admission on 05/07/2024  Component Date Value Ref Range Status   Neisseria Gonorrhea 05/07/2024 Negative   Final   Chlamydia 05/07/2024 Negative   Final   Comment 05/07/2024 Normal Reference Ranger Chlamydia - Negative   Final   Comment 05/07/2024 Normal Reference Range Neisseria Gonorrhea - Negative   Final   HIV Screen 4th Generation wRfx 05/07/2024 Non Reactive  Non Reactive Final   RPR Ser Ql 05/08/2024 NON REACTIVE  NON REACTIVE Final   Hepatitis B Surface Ag 05/12/2024 NON REACTIVE  NON REACTIVE Final   HCV Ab 05/12/2024 NON REACTIVE  NON REACTIVE Final   Hep A IgM 05/12/2024 NON REACTIVE  NON REACTIVE Final   Hep B C IgM 05/12/2024 NON REACTIVE  NON REACTIVE Final   Hgb A1c MFr Bld 05/12/2024 5.7 (H)  4.8 - 5.6 % Final   Mean Plasma Glucose 05/12/2024 116.89  mg/dL Final   Cholesterol 87/68/7974 142  0 - 200 mg/dL Final   Triglycerides 87/68/7974 83  <150 mg/dL Final   HDL 87/68/7974 44  >40 mg/dL Final   Total CHOL/HDL Ratio 05/12/2024 3.2  RATIO Final   VLDL 05/12/2024 17  0  - 40 mg/dL Final   LDL Cholesterol 05/12/2024 82  0 - 99 mg/dL Final   RPR Ser Ql 87/68/7974 NON REACTIVE  NON REACTIVE Final   TSH 05/12/2024 0.841  0.350 - 4.500 uIU/mL Final     Vitals: Blood pressure 108/70, pulse 67, temperature 98.3 F (36.8 C), temperature source Oral, resp. rate 20, height 5' 10 (1.778 m), weight 62.6 kg, SpO2 100%.    Odis Cleveland PGY-2, Psychiatry Residency  05/15/2024, 3:18 PM

## 2024-05-15 NOTE — Progress Notes (Signed)
" °   05/15/24 1000  Psych Admission Type (Psych Patients Only)  Admission Status Involuntary  Psychosocial Assessment  Patient Complaints Isolation  Eye Contact Fair  Facial Expression Flat  Affect Preoccupied  Speech Soft  Interaction Isolative;Minimal  Motor Activity Other (Comment) (WDL)  Appearance/Hygiene Disheveled  Behavior Characteristics Appropriate to situation  Mood Preoccupied  Thought Process  Coherency Blocking  Content WDL  Delusions None reported or observed  Perception WDL  Hallucination None reported or observed  Judgment Impaired  Confusion None  Danger to Self  Current suicidal ideation? Denies  Agreement Not to Harm Self Yes  Description of Agreement Verbal  Danger to Others  Danger to Others None reported or observed    "

## 2024-05-15 NOTE — Group Note (Signed)
 Date:  05/15/2024 Time:  3:29 PM  Group Topic/Focus: Social wellness  Mental health and social wellness are deeply linked: social wellness is about building and maintaining healthy, supportive relationships, which directly boosts mental health by providing emotional support, reducing stress, increasing self-esteem, and fostering a sense of belonging, while loneliness and poor social connections increase risks for anxiety and depression. Key to this are clear communication, setting boundaries, empathy, and actively connecting with others, creating a positive cycle for overall well-being     Participation Level:  Did Not Attend   Brett Wu 05/15/2024, 3:29 PM

## 2024-05-15 NOTE — Group Note (Signed)
 Date:  05/15/2024 Time:  3:22 PM  Group Topic/Focus: goals and orientation Goals Group:   The focus of this group is to help patients establish daily goals to achieve during treatment and discuss how the patient can incorporate goal setting into their daily lives to aide in recovery. Orientation:   The focus of this group is to educate the patient on the purpose and policies of crisis stabilization and provide a format to answer questions about their admission.  The group details unit policies and expectations of patients while admitted.    Participation Level:  Did Not Attend   Brett Wu 05/15/2024, 3:22 PM

## 2024-05-15 NOTE — Group Note (Addendum)
 Date:  05/15/2024 Time:  9:05 PM  Group Topic/Focus:  Wrap-Up Group:   The focus of this group is to help patients review their daily goal of treatment and discuss progress on daily workbooks.    Participation Level:  Appropriate   Participation Quality:  Appropriate   Affect:  appropriate   Cognitive:  appropriate   Insight: appropriate  Engagement in Group:  appropriate   Modes of Intervention:  discussion  Additional Comments:   Pt states that he's slept most of the day and this is the first group he's been to today. Pt denied everything   Gracelynn Bircher A Gurjot Brisco 05/15/2024, 9:05 PM

## 2024-05-15 NOTE — Group Note (Signed)
 Date:  05/15/2024 Time:  6:38 PM  Group Topic/Focus:  Dimensions of Wellness:   The focus of this group is to introduce the topic of wellness and discuss the role each dimension of wellness plays in total health.  This Group focused on building healthy boundaries.    Participation Level:  Did Not Attend    Annalee Larch 05/15/2024, 6:38 PM

## 2024-05-15 NOTE — BH Assessment (Signed)
(  Sleep Hours) - 9.5 (Any PRNs that were needed, meds refused, or side effects to meds)-  (Any disturbances and when (visitation, over night)- None (Concerns raised by the patient)- Pt remained isolative during group. (SI/HI/AVH)- Denies

## 2024-05-15 NOTE — Plan of Care (Signed)
   Problem: Education: Goal: Knowledge of [place] [facility] Education information/materials will improve Outcome: Progressing Goal: Emotional status will improve Outcome: Progressing Goal: Mental status will improve Outcome: Progressing Goal: Verbalization of understanding the information provided will improve Outcome: Progressing

## 2024-05-16 DIAGNOSIS — F29 Unspecified psychosis not due to a substance or known physiological condition: Secondary | ICD-10-CM | POA: Diagnosis not present

## 2024-05-16 DIAGNOSIS — F19959 Other psychoactive substance use, unspecified with psychoactive substance-induced psychotic disorder, unspecified: Secondary | ICD-10-CM | POA: Diagnosis not present

## 2024-05-16 MED ORDER — NICOTINE POLACRILEX 2 MG MT GUM
2.0000 mg | CHEWING_GUM | Freq: Once | OROMUCOSAL | Status: AC
  Administered 2024-05-16: 2 mg via ORAL
  Filled 2024-05-16: qty 1

## 2024-05-16 MED ORDER — NICOTINE 14 MG/24HR TD PT24
14.0000 mg | MEDICATED_PATCH | Freq: Every day | TRANSDERMAL | Status: DC
  Administered 2024-05-17 – 2024-05-29 (×7): 14 mg via TRANSDERMAL
  Filled 2024-05-16 (×10): qty 1

## 2024-05-16 NOTE — Group Note (Signed)
 Date:  05/16/2024 Time:  3:11 PM  Group Topic/Focus: Social Wellness Patients participated in a movie trivia activity designed to facilitate social interaction and cognitive engagement. The exercise promoted collective brainstorming and creative expression within a therapeutic group framework.    Participation Level:  Did Not Attend  Additional Comments:  Pt did not attend this group  Kristi HERO [facility] 05/16/2024, 3:11 PM

## 2024-05-16 NOTE — Group Note (Signed)
 Date:  05/16/2024 Time:  9:15 PM  Group Topic/Focus:  Wrap-Up Group:   The focus of this group is to help patients review their daily goal of treatment and discuss progress on daily workbooks.    Participation Level:  Active  Participation Quality:  Appropriate  Affect:  Appropriate  Cognitive:  Appropriate  Insight: Appropriate  Engagement in Group:  Engaged  Modes of Intervention:  Discussion  Additional Comments:  Patient stated that his day was good.  He rated it a 6 out of a 10.  Patient stated that his goal is to participate in groups tomorrow and wake up for  breakfast in the morning   Bari Moats 05/16/2024, 9:15 PM

## 2024-05-16 NOTE — BH Assessment (Signed)
(  Sleep Hours) - 9.25 (Any PRNs that were needed, meds refused, or side effects to meds)-  (Any disturbances and when (visitation, over night)- None (Concerns raised by the patient)- Pt is noted isolative and is locked out. (SI/HI/AVH)- Denies

## 2024-05-16 NOTE — Group Note (Signed)
 Date:  05/16/2024 Time:  9:18 AM  Group Topic/Focus: Goals group  Following an initial icebreaker and introductions, patients engaged in a structured goal-setting activity. Utilizing SMART goal worksheets, participants identified and disclosed personal objectives related to recovery maintenance and self-care strategies.   Participation Level:  Did Not Attend  Additional Comments:  Pt did not attend goals group  Kristi HERO [facility] 05/16/2024, 9:18 AM

## 2024-05-16 NOTE — Plan of Care (Signed)
   Problem: Education: Goal: Knowledge of [place] [facility] Education information/materials will improve Outcome: Progressing Goal: Emotional status will improve Outcome: Progressing Goal: Mental status will improve Outcome: Progressing Goal: Verbalization of understanding the information provided will improve Outcome: Progressing

## 2024-05-16 NOTE — Progress Notes (Signed)
" °   05/16/24 1244  Psych Admission Type (Psych Patients Only)  Admission Status Involuntary  Psychosocial Assessment  Patient Complaints Isolation  Eye Contact Fair  Facial Expression Flat  Affect Preoccupied  Speech Soft  Interaction Guarded;Minimal;Isolative  Motor Activity Other (Comment) (WDL)  Appearance/Hygiene Disheveled;Poor hygiene  Behavior Characteristics Unwilling to participate;Guarded  Mood Preoccupied  Thought Process  Coherency Blocking  Content WDL  Delusions None reported or observed  Perception WDL  Hallucination None reported or observed  Judgment Poor  Confusion None  Danger to Self  Current suicidal ideation? Denies  Agreement Not to Harm Self Yes  Description of Agreement Notify Staff  Danger to Others  Danger to Others None reported or observed    "

## 2024-05-16 NOTE — Progress Notes (Signed)
 [facility] Inpatient Psychiatry Progress Note  Date: 05/16/2024 Patient: Brett Wu MRN: [id number]   ASSESSMENT:  Brett Wu is a 29 y.o. male  with a past psychiatric history of schizophrenia, methamphetamine use, and cannabis use disorder. Patient initially arrived to [facility] on 05/07/24 for increasing auditory hallucinations, delusions, and suicidal ideation and was admitted to [facility] under IVC on 05/07/24 for acute safety concerns and severe substance-induced psychosis or mood disturbances. No significant past medical history.   Patient is a 29 year old male with a past psychiatric history of methamphetamine use disorder, schizophrenia, cannabis use disorder presenting for increasing auditory hallucinations, delusions, and suicidal ideation. Differential diagnosis includes primary psychotic disorder vs substance-induced psychosis vs substance-induced mood disorder. Per chart review, patient has had a good response to Zyprexa  in the past, however had not been compliant with medications outpatient. Plan to continue Zyprexa  given past effectiveness. '  1/4: Patient again refused to interact with medical team.  Did not appear catatonic, good movement, spoke to team.  Given ACT team's description of patient's baseline, will continue his current olanzapine  20 mg nightly and start Prozac  20 mg for depressed affect. Will need to reach out to ACT team again on Monday.  PLAN:  # Primary psychotic disorder vs substance-induced psychosis -- Continue Zyprexa  Zydis 20 mg at bedtime - Start Prozac  20 mg daily for suspected depressed mood.   PRN's - Trazodone  50 mg at bedtime as needed for insomnia - Atarax  25 mg TID as needed for anxiety - Agitation Protocol: haldol  + ativan  + benadryl    2. Active Medical Issues   #Nicotine  withdrawal - Patient in need of nicotine  replacement; nicotine  patch 14 mg / 24 hours ordered. Smoking cessation encouraged   Other as needed  medications Tylenol  650 mg every 6 hours as needed for pain Mylanta 30 mL every 4 hours as needed for indigestion Milk of magnesia 30 mL daily as needed for constipation   The risks/benefits/side-effects/alternatives to the above medication(s) were discussed in detail with the patient and time was given for questions. The patient consents to medication trial. FDA black box warnings, if present, were discussed.   3. Safety and Monitoring: - Involuntary admission to inpatient psychiatric unit for safety, stabilization and treatment - Daily contact with patient to assess and evaluate symptoms and progress in treatment - Patient's case to be discussed in multi-disciplinary team meeting - Observation Level: q15 minute checks - Vital signs:  q12 hours - Precautions: suicide, elopement, and assault   4. Routine and other pertinent labs: EKG monitoring: QTc: 421 on 05/07/24   Metabolism / endocrine: BMI: Body mass index is 22.15 kg/m.   CBC: WBC 12 CMP: unremarkable UDS: pos for amphetamines Ethanol: <10 TSH: WNL A1c: 5.7% Lipid panel: WNL HIV: negative RPR: pending   Risk Assessment: Patient continues to require inpatient hospitalization for safety and stabilization of acute psychosis.  Discharge Planning: Barriers to Discharge: resolution of psychosis Estimated Length of Stay: 3 to 6 days Predicted Discharge Location: group home  INTERVAL HISTORY: Slept 9.5 hours. Tylenol  650 mg. Zyprexa  15 mg at bedtime.  Isolative during group.  Denied SI, HI, AVH.  Patient again refused to interact with medical team, laying in bed.  Discussed with patient that, per conversation with patient's ACT team, he likely has an apartment to go back to and that he is not yet affected.  Did not answer questions regarding suicidal ideation, homicidal ideation, auditory visual hallucinations, somatic symptomatology, and medication side effects.  Physical Examination:  Vitals and nursing note  reviewed MSK: largely able to move limbs HEENT: Well-healing right sided periorbital contusion, cut above right eye does not appear infectious at this time  MENTAL STATUS EXAM:  Appearance: Patient is a white male with long, unkempt beard and hair with right periorbital contusion and a small scratch above R eyebrow, well-healing  Behavior: Uncooperative   Attitude: Irritated  Speech: UTA  Mood: UTA  Affect: Irritable  Thought Process: UTA  Thought UTA  SI/HI: UTA  Perceptions: UTA  Judgement: Poor  Insight: UTA  Fund of Knowledge: UTA   Lab Results:  Admission on 05/07/2024  Component Date Value Ref Range Status   Neisseria Gonorrhea 05/07/2024 Negative   Final   Chlamydia 05/07/2024 Negative   Final   Comment 05/07/2024 Normal Reference Ranger Chlamydia - Negative   Final   Comment 05/07/2024 Normal Reference Range Neisseria Gonorrhea - Negative   Final   HIV Screen 4th Generation wRfx 05/07/2024 Non Reactive  Non Reactive Final   RPR Ser Ql 05/08/2024 NON REACTIVE  NON REACTIVE Final   Hepatitis B Surface Ag 05/12/2024 NON REACTIVE  NON REACTIVE Final   HCV Ab 05/12/2024 NON REACTIVE  NON REACTIVE Final   Hep A IgM 05/12/2024 NON REACTIVE  NON REACTIVE Final   Hep B C IgM 05/12/2024 NON REACTIVE  NON REACTIVE Final   Hgb A1c MFr Bld 05/12/2024 5.7 (H)  4.8 - 5.6 % Final   Mean Plasma Glucose 05/12/2024 116.89  mg/dL Final   Cholesterol 87/68/7974 142  0 - 200 mg/dL Final   Triglycerides 87/68/7974 83  <150 mg/dL Final   HDL 87/68/7974 44  >40 mg/dL Final   Total CHOL/HDL Ratio 05/12/2024 3.2  RATIO Final   VLDL 05/12/2024 17  0 - 40 mg/dL Final   LDL Cholesterol 05/12/2024 82  0 - 99 mg/dL Final   RPR Ser Ql 87/68/7974 NON REACTIVE  NON REACTIVE Final   TSH 05/12/2024 0.841  0.350 - 4.500 uIU/mL Final     Vitals: Blood pressure 114/67, pulse 70, temperature 98.3 F (36.8 C), temperature source Oral, resp. rate 20, height 5' 10 (1.778 m), weight 62.6 kg, SpO2 98%.     Signed: JINNY Morene GORMAN Delsie, MD [facility] Physician, PGY-2 05/16/2024 4:09 PM

## 2024-05-16 NOTE — Plan of Care (Signed)

## 2024-05-16 NOTE — BHH Group Notes (Signed)
 Brett Wu did not attend the CSW group 05/16/24

## 2024-05-16 NOTE — Group Note (Signed)
 Date:  05/16/2024 Time:  5:06 PM  Group Topic/Focus:   The focus of this group is to introduce the topic of wellness using collage. Patients created intuitive collages: serving as a creative outlet for expressing emotions, reducing anxiety, fostering group cohesion and enabling personal insight and healing.      Participation Level:  Did Not Attend  Brett Wu 05/16/2024, 5:06 PM

## 2024-05-16 NOTE — Group Note (Addendum)
"                                                 [facility] LCSW Group Therapy Note    Group Date: 05/16/2024 Start Time: 0940 End Time: 1030  Type of Therapy and Topic:  Group Therapy:  Overcoming Obstacles  Participation Level:  Active    Description of Group:   In this group patients will be encouraged to explore what they see as obstacles to their own wellness and recovery. They will be guided to discuss their thoughts, feelings, and behaviors related to these obstacles. The group will process together ways to cope with barriers, with attention given to specific choices patients can make. Each patient will be challenged to identify changes they are motivated to make in order to overcome their obstacles. This group will be process-oriented, with patients participating in exploration of their own experiences as well as giving and receiving support and challenge from other group members.  Therapeutic Goals: 1. Patient will identify personal and current obstacles as they relate to admission. 2. Patient will identify barriers that currently interfere with their wellness or overcoming obstacles.  3. Patient will identify feelings, thought process and behaviors related to these barriers. 4. Patient will identify two changes they are willing to make to overcome these obstacles:    Summary of Patient Progress  Pt was present and respectful during group, pt was also able to identify feelings, thought process and behaviors pertaining to their wellness or overcoming obstacles.   Therapeutic Modalities:   Cognitive Behavioral Therapy Solution Focused Therapy Motivational Interviewing Relapse Prevention Therapy   Brett Louder, LCSW "

## 2024-05-17 DIAGNOSIS — F29 Unspecified psychosis not due to a substance or known physiological condition: Secondary | ICD-10-CM | POA: Diagnosis not present

## 2024-05-17 DIAGNOSIS — F19959 Other psychoactive substance use, unspecified with psychoactive substance-induced psychotic disorder, unspecified: Secondary | ICD-10-CM | POA: Diagnosis not present

## 2024-05-17 MED ORDER — OLANZAPINE 15 MG PO TBDP
15.0000 mg | ORAL_TABLET | Freq: Every day | ORAL | Status: DC
  Administered 2024-05-17 – 2024-05-28 (×12): 15 mg via ORAL
  Filled 2024-05-17 (×11): qty 1

## 2024-05-17 MED ORDER — OLANZAPINE 5 MG PO TBDP
5.0000 mg | ORAL_TABLET | Freq: Every day | ORAL | Status: DC
  Administered 2024-05-18 – 2024-05-29 (×11): 5 mg via ORAL
  Filled 2024-05-17 (×11): qty 1

## 2024-05-17 NOTE — Progress Notes (Signed)
 Brett Wu   Type of Note: IVC court hearing 1/6  Spoke with patient this afternoon regarding virtual court hearing tomorrow afternoon. Pt would like to participate in hearing.  No other needs at this time.   Signed:  Yousaf Sainato, LCSW-A 05/17/2024  2:08 PM

## 2024-05-17 NOTE — Progress Notes (Signed)
" °   05/17/24 2300  Psych Admission Type (Psych Patients Only)  Admission Status Involuntary  Psychosocial Assessment  Patient Complaints Anxiety  Eye Contact Fair  Facial Expression Flat  Affect Preoccupied  Speech Soft;Logical/coherent  Interaction Minimal;Guarded  Motor Activity Other (Comment) (appropriate)  Appearance/Hygiene Disheveled  Behavior Characteristics Guarded  Mood Depressed;Preoccupied  Thought Process  Coherency Blocking  Content WDL  Delusions None reported or observed  Perception WDL  Hallucination None reported or observed  Judgment Poor  Confusion None  Danger to Self  Current suicidal ideation? Denies  Agreement Not to Harm Self Yes  Description of Agreement Verbal  Danger to Others  Danger to Others None reported or observed    "

## 2024-05-17 NOTE — Group Note (Signed)
 Date:  05/17/2024 Time:  10:06 AM  Group Topic/Focus:  Goals Group:   The focus of this group is to help patients establish daily goals to achieve during treatment and discuss how the patient can incorporate goal setting into their daily lives to aide in recovery.    Participation Level:  Active  Participation Quality:  Appropriate  Affect:  Appropriate  Cognitive:  Appropriate  Insight: Appropriate  Engagement in Group:  Engaged  Modes of Intervention:  Discussion  Additional Comments:  being discharged  Nat Rummer 05/17/2024, 10:06 AM

## 2024-05-17 NOTE — Progress Notes (Signed)
 D: Denies SI, HI, AVH, and verbally contracts for safety.   A: Scheduled medications administered per MD order. Support provided. Patient educated on safety on the unit and medications. Routine safety checks every 15 minutes. Patient stated understanding to tell nurse about any new physical symptoms. Patient understands to tell staff of any needs.     R: No adverse drug reactions noted. Patient remains safe at this time and will continue to monitor.    05/17/24 1000  Psych Admission Type (Psych Patients Only)  Admission Status Involuntary  Psychosocial Assessment  Patient Complaints Anxiety  Eye Contact Fair  Facial Expression Flat  Affect Preoccupied  Speech Soft;Logical/coherent  Interaction Minimal;Guarded  Motor Activity Other (Comment) (WNL)  Appearance/Hygiene Disheveled  Behavior Characteristics Calm;Guarded  Mood Depressed;Preoccupied  Thought Process  Coherency Blocking  Content WDL  Delusions None reported or observed  Perception WDL  Hallucination None reported or observed  Judgment Poor  Confusion None  Danger to Self  Current suicidal ideation? Denies  Agreement Not to Harm Self Yes  Description of Agreement verbal  Danger to Others  Danger to Others None reported or observed

## 2024-05-17 NOTE — Progress Notes (Signed)
(  Sleep Hours) - 11.25 (Any PRNs that were needed, meds refused, or side effects to meds)- PRN Tylenol , PRN Hydroxyzine , PRN Trazodone , and one time dose of Nicotine  gum (Any disturbances and when (visitation, over night)- None (Concerns raised by the patient)-  Pt requested for the dose of Zyprexa  to be reduced. Pt reports that the Zyprexa  has been making him to too sleepy. Encouraged pt to speak with Provider about adjusting the medication dose.  (SI/HI/AVH)- Denies. Contracts for safety.

## 2024-05-17 NOTE — Group Note (Signed)
 Occupational Therapy Group Note   Group Topic:Goal Setting  Group Date: 05/17/2024 Start Time: 1500 End Time: 1540 Facilitators: Dot [place] MATSU, OT   Group Description: Group encouraged engagement and participation through discussion focused on goal setting. Group members were introduced to goal-setting using the SMART Goal framework, identifying goals as Specific, Measureable, Acheivable, Relevant, and Time-Bound. Group members took time from group to create their own personal goal reflecting the SMART goal template and shared for review by peers and OT.    Therapeutic Goal(s):  Identify at least one goal that fits the SMART framework    Participation Level: Engaged   Participation Quality: Independent   Behavior: Appropriate   Speech/Thought Process: Relevant   Affect/Mood: Appropriate   Insight: Fair   Judgement: Fair      Modes of Intervention: Education  Patient Response to Interventions:  Attentive   Plan: Continue to engage patient in OT groups 2 - 3x/week.  05/17/2024  [place] MATSU Dot, OT  Brett Wu, OT

## 2024-05-17 NOTE — Plan of Care (Signed)

## 2024-05-17 NOTE — Progress Notes (Signed)
 [facility] Inpatient Psychiatry Progress Note  Date: 05/17/2024 Patient: Brett Wu MRN: [id number]   ASSESSMENT:  Brett Wu is a 29 y.o. male  with a past psychiatric history of schizophrenia, methamphetamine use, and cannabis use disorder. Patient initially arrived to [facility] on 05/07/24 for increasing auditory hallucinations, delusions, and suicidal ideation and was admitted to [facility] under IVC on 05/07/24 for acute safety concerns and severe substance-induced psychosis or mood disturbances. No significant past medical history.   Patient is a 29 year old male with a past psychiatric history of methamphetamine use disorder, schizophrenia, cannabis use disorder presenting for increasing auditory hallucinations, delusions, and suicidal ideation. Differential diagnosis includes primary psychotic disorder vs substance-induced psychosis vs substance-induced mood disorder. Per chart review, patient has had a good response to Zyprexa  in the past, however had not been compliant with medications outpatient. Plan to continue Zyprexa  given past effectiveness. '  1/5: Patient continues to be in an interaction with medical team.  He was responsive to some questioning by this provider and willing to engage in some conversation.  Will need to continue to discuss ACT team about patient's baseline.  Patient made notes that he felt overly sedated on nighttime Zyprexa .  Discussed the option of adjusting dosage, which patient was amenable with. Will continue to monitor and encourage patient to attend groups during the day.   PLAN:  # Primary psychotic disorder vs substance-induced psychosis -- Adjust dose to Zyprexa  5 mg daily and 15 mg at bedtime, to limit oversedation in the day time, increased Zyprexa  Zydis 20 mg at bedtime during this admission  - Continue Prozac  20 mg daily for suspected depressed mood.   PRN's - Trazodone  50 mg at bedtime as needed for insomnia - Atarax  25 mg TID  as needed for anxiety - Agitation Protocol: haldol  + ativan  + benadryl    2. Active Medical Issues   #Nicotine  withdrawal - Patient in need of nicotine  replacement; nicotine  patch 14 mg / 24 hours ordered. Smoking cessation encouraged   Other as needed medications Tylenol  650 mg every 6 hours as needed for pain Mylanta 30 mL every 4 hours as needed for indigestion Milk of magnesia 30 mL daily as needed for constipation   The risks/benefits/side-effects/alternatives to the above medication(s) were discussed in detail with the patient and time was given for questions. The patient consents to medication trial. FDA black box warnings, if present, were discussed.   3. Safety and Monitoring: - Involuntary admission to inpatient psychiatric unit for safety, stabilization and treatment - Daily contact with patient to assess and evaluate symptoms and progress in treatment - Patient's case to be discussed in multi-disciplinary team meeting - Observation Level: q15 minute checks - Vital signs:  q12 hours - Precautions: suicide, elopement, and assault   4. Routine and other pertinent labs: EKG monitoring: QTc: 421 on 05/07/24   Metabolism / endocrine: BMI: Body mass index is 22.15 kg/m.   CBC: WBC 12 CMP: unremarkable UDS: pos for amphetamines Ethanol: <10 TSH: WNL A1c: 5.7% Lipid panel: WNL HIV: negative RPR: pending   Risk Assessment: Patient continues to require inpatient hospitalization for safety and stabilization of acute psychosis.  Discharge Planning: Barriers to Discharge: resolution of psychosis Estimated Length of Stay: 3 to 6 days Predicted Discharge Location: group home  INTERVAL HISTORY: Slept 11.25 hours. Tylenol  650 mg. Zyprexa  20 mg at bedtime.  Isolative during group and only going to evening sessions. Denied SI, HI, AVH.  Assessed at bedside and he maintained covers  over his head during the entire encounter.  Reports that his mood feels fine and he is all  right.  He reports his depression is 8 out of 10 and anxiety is a 7 out of 10, with 10 being most severe.  Patient reports he does not know why his mood symptoms are so severe what they are attributed to.  When asked about suicidal ideation, patient reported that he does not know if he is suicidal or not.  He denies homicidal ideation and auditory visual hallucinations.  She reports that he does go to group sessions during the night.  He does note that he does feel very sleepy with current Zyprexa  dosage and requested discussion with this provider.  Discussed the option of adjusting dose to limit sedation, patient amenable with just adjustment.  Denies any intolerable side effects with starting the Prozac , and feels that it has been somewhat helpful.  Physical Examination:  Vitals and nursing note reviewed MSK: largely able to move limbs HEENT: Well-healing right sided periorbital contusion, cut above right eye does not appear infectious at this time  MENTAL STATUS EXAM:  Appearance: Patient has covers of his head during the entire encounter   Behavior: More cooperative today, than prior exams  Attitude: Fine   Speech: Slow to respond   Mood: Alright   Affect: Non-congruent, flat  Thought Process: Coherent  Thought: Denies AVH, paranoia, uncertain if patient responding to RIS d/t having covers over head.  SI/HI: SI  I dont know, Denies HI   Perceptions: Denies  Judgement: Poor  Insight: Limited  Fund of Knowledge: Fair   Lab Results:  Admission on 05/07/2024  Component Date Value Ref Range Status   Neisseria Gonorrhea 05/07/2024 Negative   Final   Chlamydia 05/07/2024 Negative   Final   Comment 05/07/2024 Normal Reference Ranger Chlamydia - Negative   Final   Comment 05/07/2024 Normal Reference Range Neisseria Gonorrhea - Negative   Final   HIV Screen 4th Generation wRfx 05/07/2024 Non Reactive  Non Reactive Final   RPR Ser Ql 05/08/2024 NON REACTIVE  NON REACTIVE Final    Hepatitis B Surface Ag 05/12/2024 NON REACTIVE  NON REACTIVE Final   HCV Ab 05/12/2024 NON REACTIVE  NON REACTIVE Final   Hep A IgM 05/12/2024 NON REACTIVE  NON REACTIVE Final   Hep B C IgM 05/12/2024 NON REACTIVE  NON REACTIVE Final   Hgb A1c MFr Bld 05/12/2024 5.7 (H)  4.8 - 5.6 % Final   Mean Plasma Glucose 05/12/2024 116.89  mg/dL Final   Cholesterol 87/68/7974 142  0 - 200 mg/dL Final   Triglycerides 87/68/7974 83  <150 mg/dL Final   HDL 87/68/7974 44  >40 mg/dL Final   Total CHOL/HDL Ratio 05/12/2024 3.2  RATIO Final   VLDL 05/12/2024 17  0 - 40 mg/dL Final   LDL Cholesterol 05/12/2024 82  0 - 99 mg/dL Final   RPR Ser Ql 87/68/7974 NON REACTIVE  NON REACTIVE Final   TSH 05/12/2024 0.841  0.350 - 4.500 uIU/mL Final     Vitals: Blood pressure 106/65, pulse 75, temperature 98.3 F (36.8 C), temperature source Oral, resp. rate 20, height 5' 10 (1.778 m), weight 62.6 kg, SpO2 98%.    Signed: Patti Olden, MD [facility] Physician, PGY-2 05/17/2024 12:59 PM

## 2024-05-17 NOTE — Progress Notes (Signed)
 Pt did attend occ. therapy

## 2024-05-17 NOTE — Plan of Care (Signed)
  Problem: Activity: Goal: Interest or engagement in activities will improve Outcome: Progressing   Problem: Safety: Goal: Periods of time without injury will increase Outcome: Progressing

## 2024-05-17 NOTE — Group Note (Signed)
 Recreation Therapy Group Note   Group Topic:Communication  Group Date: 05/17/2024 Start Time: 0945 End Time: 1010 Facilitators: Carmyn Hamm-McCall, LRT,CTRS Location: [address] Dayroom   Group Topic: Communication, Team Building, Problem Solving  Goal Area(s) Addresses:  Patient will effectively work with peer towards shared goal.  Patient will identify skills used to make activity successful.  Patient will identify how skills used during activity can be applied to reach post d/c goals.   Behavioral Response:   Intervention: STEM Activity- Glass Blower/designer  Activity: Tallest [employer]. In teams of 5-6, patients were given 11 craft pipe cleaners. Using the materials provided, patients were instructed to compete again the opposing team(s) to build the tallest free-standing structure from floor level. The activity was timed; difficulty increased by clinical research associate as production designer, theatre/television/film continued.  Systematically resources were removed with additional directions for example, placing one arm behind their back, working in silence, and shape stipulations. LRT facilitated post-activity discussion reviewing team processes and necessary communication skills involved in completion. Patients were encouraged to reflect how the skills utilized, or not utilized, in this activity can be incorporated to positively impact support systems post discharge.  Education: Pharmacist, Community, Scientist, Physiological, Discharge Planning   Education Outcome: Acknowledges education/In group clarification offered/Needs additional education.    Affect/Mood: N/A   Participation Level: Did not attend    Clinical Observations/Individualized Feedback:      Plan: Continue to engage patient in RT group sessions 2-3x/week.   Sandon Yoho-McCall, LRT,CTRS 05/17/2024 1:28 PM

## 2024-05-17 NOTE — BHH Group Notes (Signed)
 Adult Psychoeducational Group Note  Date:  05/17/2024 Time:  9:41 PM  Group Topic/Focus:  Wrap-Up Group:   The focus of this group is to help patients review their daily goal of treatment and discuss progress on daily workbooks.  Participation Level:  Did Not Attend  Aisha Celestine Ruth 05/17/2024, 9:41 PM

## 2024-05-18 DIAGNOSIS — F19959 Other psychoactive substance use, unspecified with psychoactive substance-induced psychotic disorder, unspecified: Secondary | ICD-10-CM | POA: Diagnosis not present

## 2024-05-18 DIAGNOSIS — F29 Unspecified psychosis not due to a substance or known physiological condition: Secondary | ICD-10-CM | POA: Diagnosis not present

## 2024-05-18 NOTE — Progress Notes (Signed)
 [facility] Inpatient Psychiatry Progress Note  Date: 05/18/2024 Patient: Brett Wu MRN: [id number]   ASSESSMENT:  Brett Wu is a 29 y.o. male  with a past psychiatric history of schizophrenia, methamphetamine use, and cannabis use disorder. Patient initially arrived to [facility] on 05/07/24 for increasing auditory hallucinations, delusions, and suicidal ideation and was admitted to [facility] under IVC on 05/07/24 for acute safety concerns and severe substance-induced psychosis or mood disturbances. No significant past medical history.   Patient is a 29 year old male with a past psychiatric history of methamphetamine use disorder, schizophrenia, cannabis use disorder presenting for increasing auditory hallucinations, delusions, and suicidal ideation. Differential diagnosis includes primary psychotic disorder vs substance-induced psychosis vs substance-induced mood disorder. Per chart review, patient has had a good response to Zyprexa  in the past, however had not been compliant with medications outpatient. Plan to continue Zyprexa  given past effectiveness. '  1/6: More interactive with the prior provider this morning and at least took the covers off of his head..  More engaged with conversation and willing to provide some answers.  Patient continues to report significant depressive symptoms, however at times during assessment either laugh, state I'm fine and displayed an incongruent affect.  Tolerating decreased dosage of nighttime Zyprexa  without any oversedation.  Will need to continue to discuss ACT team about patient's baseline and assist with dispo planning.   PLAN:  # Primary psychotic disorder vs substance-induced psychosis -- Continue with Zyprexa  5 mg daily and 15 mg at bedtime, to limit oversedation in the day time   - Continue Prozac  20 mg daily for suspected depressed mood.   PRN's - Trazodone  50 mg at bedtime as needed for insomnia - Atarax  25 mg TID as needed  for anxiety - Agitation Protocol: haldol  + ativan  + benadryl    2. Active Medical Issues   #Nicotine  withdrawal - Patient in need of nicotine  replacement; nicotine  patch 14 mg / 24 hours ordered. Smoking cessation encouraged   Other as needed medications Tylenol  650 mg every 6 hours as needed for pain Mylanta 30 mL every 4 hours as needed for indigestion Milk of magnesia 30 mL daily as needed for constipation   The risks/benefits/side-effects/alternatives to the above medication(s) were discussed in detail with the patient and time was given for questions. The patient consents to medication trial. FDA black box warnings, if present, were discussed.   3. Safety and Monitoring: - Involuntary admission to inpatient psychiatric unit for safety, stabilization and treatment - Daily contact with patient to assess and evaluate symptoms and progress in treatment - Patient's case to be discussed in multi-disciplinary team meeting - Observation Level: q15 minute checks - Vital signs:  q12 hours - Precautions: suicide, elopement, and assault   4. Routine and other pertinent labs: EKG monitoring: QTc: 421 on 05/07/24   Metabolism / endocrine: BMI: Body mass index is 22.15 kg/m.   CBC: WBC 12 CMP: unremarkable UDS: pos for amphetamines Ethanol: <10 TSH: WNL A1c: 5.7% Lipid panel: WNL HIV: negative RPR: pending   Risk Assessment: Patient continues to require inpatient hospitalization for safety and stabilization of acute psychosis.  Discharge Planning: Barriers to Discharge: resolution of psychosis Estimated Length of Stay: 3 to 6 days Predicted Discharge Location: group home  INTERVAL HISTORY: Slept 8.5 hours. More engaged in groups and only going to evening sessions. Denied SI, HI, AVH.  Assessed at bedside and covers were off his head/face throughout the encounter.  Reports that his mood feels fine and he is  all right.  He reports his depression is 10 out of 10 and anxiety is a  10 out of 10, with 10 being most severe.  Patient reports he does not know why his mood symptoms are so severe what they are attributed to.  When asked about suicidal ideation, patient reported that he does not know if he is suicidal or not.  He denies homicidal ideation and auditory visual hallucinations. Patient denies oversedating effects on decreased Zyprexa  night time dose.  Patient states he last spoke with his sister yesterday and thinks the conversation went well.  He anticipates being an uncle to her son to be born child.   Physical Examination:  Vitals and nursing note reviewed MSK: largely able to move limbs HEENT: Well-healing right sided periorbital contusion, cut above right eye does not appear infectious at this time  MENTAL STATUS EXAM:  Appearance: Patient has covers of his head during the entire encounter   Behavior: More cooperative today, than prior exams  Attitude: Fine   Speech: Slow to respond   Mood: Fine   Affect: Non-congruent, flat  Thought Process: Coherent  Thought: Denies AVH, paranoia, does not appear to be responding to internal stimuli  SI/HI: SI  I dont know, Denies HI   Perceptions: Denies  Judgement: Poor  Insight: Limited  Fund of Knowledge: Fair   Lab Results:  Admission on 05/07/2024  Component Date Value Ref Range Status   Neisseria Gonorrhea 05/07/2024 Negative   Final   Chlamydia 05/07/2024 Negative   Final   Comment 05/07/2024 Normal Reference Ranger Chlamydia - Negative   Final   Comment 05/07/2024 Normal Reference Range Neisseria Gonorrhea - Negative   Final   HIV Screen 4th Generation wRfx 05/07/2024 Non Reactive  Non Reactive Final   RPR Ser Ql 05/08/2024 NON REACTIVE  NON REACTIVE Final   Hepatitis B Surface Ag 05/12/2024 NON REACTIVE  NON REACTIVE Final   HCV Ab 05/12/2024 NON REACTIVE  NON REACTIVE Final   Hep A IgM 05/12/2024 NON REACTIVE  NON REACTIVE Final   Hep B C IgM 05/12/2024 NON REACTIVE  NON REACTIVE Final   Hgb A1c  MFr Bld 05/12/2024 5.7 (H)  4.8 - 5.6 % Final   Mean Plasma Glucose 05/12/2024 116.89  mg/dL Final   Cholesterol 87/68/7974 142  0 - 200 mg/dL Final   Triglycerides 87/68/7974 83  <150 mg/dL Final   HDL 87/68/7974 44  >40 mg/dL Final   Total CHOL/HDL Ratio 05/12/2024 3.2  RATIO Final   VLDL 05/12/2024 17  0 - 40 mg/dL Final   LDL Cholesterol 05/12/2024 82  0 - 99 mg/dL Final   RPR Ser Ql 87/68/7974 NON REACTIVE  NON REACTIVE Final   TSH 05/12/2024 0.841  0.350 - 4.500 uIU/mL Final     Vitals: Blood pressure 120/70, pulse 74, temperature 98.3 F (36.8 C), temperature source Oral, resp. rate 15, height 5' 10 (1.778 m), weight 62.6 kg, SpO2 99%.    Signed: Patti Olden, MD [facility] Physician, PGY-2 05/18/2024 12:34 PM

## 2024-05-18 NOTE — Progress Notes (Signed)
" °   05/18/24 2200  Psych Admission Type (Psych Patients Only)  Admission Status Involuntary  Psychosocial Assessment  Patient Complaints None  Eye Contact Fair  Facial Expression Flat  Affect Flat  Speech Soft  Interaction Minimal;Isolative  Motor Activity Slow  Appearance/Hygiene Disheveled  Behavior Characteristics Guarded  Mood Depressed  Thought Process  Coherency Blocking  Content WDL  Delusions None reported or observed  Perception WDL  Hallucination None reported or observed  Judgment Impaired  Confusion None  Danger to Self  Current suicidal ideation? Denies  Agreement Not to Harm Self Yes  Description of Agreement Verbal  Danger to Others  Danger to Others None reported or observed    "

## 2024-05-18 NOTE — Group Note (Signed)
 Date:  05/18/2024 Time:  3:53 PM  Group Topic/Focus: [place] Group The goal of this adult mental health group is to support participants in improving emotional awareness, developing healthy coping skills, and strengthening social connections. Through guided discussions, skill-building activities, and peer support, group members will learn practical strategies to manage stress, regulate emotions, and communicate their needs more effectively. Progress will be measured through participation, self-reported use of coping skills, and completion of personal wellness plans over the course of the group program.    Participation Level:  Did Not Attend   [name] Brett Wu 05/18/2024, 3:53 PM

## 2024-05-18 NOTE — [facility] Group Notes (Signed)
 [facility] Group Notes:  (Nursing/MHT/Case Management/Adjunct)  Date:  05/18/2024  Time:  11:15 PM  Type of Therapy:  WRAP UP GROUP  Participation Level:  Did Not Attend  Participation Quality:    Affect:    Cognitive:    Insight:    Engagement in Group:    Modes of Intervention:    Summary of Progress/Problems:  Grayce LITTIE Essex 05/18/2024, 11:15 PM

## 2024-05-18 NOTE — Progress Notes (Addendum)
 Pt refused morning vital signs. Pt isolative and guarded with staff. Pt refuses to interact with majority of staff. Pt compliant with taking medications. Q 15 minute safety checks in place for safety.    05/18/24 0900  Psych Admission Type (Psych Patients Only)  Admission Status Involuntary  Psychosocial Assessment  Patient Complaints None  Eye Contact Fair  Facial Expression Flat  Affect Flat  Speech Soft  Interaction Isolative;Minimal;Guarded  Motor Activity Slow  Appearance/Hygiene Disheveled  Behavior Characteristics Guarded  Mood Depressed  Thought Process  Coherency Blocking  Content WDL  Delusions None reported or observed  Perception WDL  Hallucination None reported or observed  Judgment Impaired  Confusion None  Danger to Self  Current suicidal ideation? Denies  Agreement Not to Harm Self Yes  Description of Agreement verbal  Danger to Others  Danger to Others None reported or observed

## 2024-05-18 NOTE — Group Note (Signed)
 LCSW Group Therapy Note   Group Date: 05/18/2024 Start Time: 1100 End Time: 1200   Participation:  did not attend  Type of Therapy:  Group Therapy  Topic:  Speaking from the Heart: Communicating with Understanding and Empathy  Objective:  To help participants develop effective communication skills to express themselves clearly, listen actively, and navigate conflicts in a healthy way.  Goals: Increase awareness of verbal and non-verbal communication skills. Practice using I statements and active listening techniques. Learn coping strategies for managing communication stress.  Summary:  Participants explored the importance of communication, discussed challenges, and practiced skills such as active listening and assertive expression. They reflected on past experiences and identified ways to improve communication in their daily lives.  Therapeutic Modalities: Cognitive-Behavioral Therapy (CBT): Restructuring negative thought patterns in communication. Mindfulness: Staying present and calm during conversations. Psychoeducation: Learning about effective communication techniques.   Brett Wu, LCSWA 05/18/2024  12:22 PM

## 2024-05-18 NOTE — Progress Notes (Addendum)
(  Sleep Hours) - 8.5  (Any PRNs that were needed, meds refused, or side effects to meds)- Trazodone  50mg  PO, Atarax  25mg  PO, no meds refused, no side effects to meds  (Any disturbances and when (visitation, over night)- N/A (Concerns raised by the patient)- N/A  (SI/HI/AVH)- Denies

## 2024-05-18 NOTE — Group Note (Signed)
 Recreation Therapy Group Note   Group Topic:Animal Assisted Therapy   Group Date: 05/18/2024 Start Time: 0950 End Time: 1030 Facilitators: Samanthan Dugo-McCall, LRT,CTRS Location: [address] Dayroom   Animal-Assisted Activity (AAA) Program Checklist/Progress Notes Patient Eligibility Criteria Checklist & Daily Group note for Rec Tx Intervention  AAA/T Program Assumption of Risk Form signed by Patient/ or Parent Legal Guardian Yes  Patient understands his/her participation is voluntary Yes  Behavioral Response:    Education: Charity Fundraiser, Appropriate Animal Interaction   Education Outcome: Acknowledges education.    Affect/Mood: N/A   Participation Level: Did not attend    Clinical Observations/Individualized Feedback:      Plan: Continue to engage patient in RT group sessions 2-3x/week.   Brett Wu, LRT,CTRS 05/18/2024 12:59 PM

## 2024-05-18 NOTE — Group Note (Signed)
 Date:  05/18/2024 Time:  10:29 AM  Group Topic/Focus: Pet therapy Pet therapy, also known as animal-assisted therapy, is a supportive approach used to improve mental health in adults by incorporating trained animals into therapeutic settings. Interaction with animals can help reduce stress, anxiety, and feelings of loneliness while promoting relaxation and emotional comfort. Pet therapy has been shown to improve mood, encourage social interaction, and increase engagement in treatment, especially for individuals experiencing depression, anxiety, PTSD, or chronic stress. When guided by a trained professional and used alongside traditional mental health care, pet therapy can be a safe and effective way to support emotional well-being and overall mental health.    Participation Level:  Did Not Attend   Brett Wu 05/18/2024, 10:29 AM

## 2024-05-18 NOTE — Progress Notes (Signed)
 Dorise DELENA Bars   Type of Note: [facility]  Left message for Garnette [phone]) who is the ACT team lead. Awaiting call back at this time.  Signed:  Reisha Wos, LCSW-A 05/18/2024  3:04 PM

## 2024-05-18 NOTE — Progress Notes (Signed)
 Brett Wu   Type of Note: IVC court hearing  Patient and this clinical research associate were present for virtual court hearing at 200 PM. The court has granted [facility] an additional 14 days.   After the court hearing patient signed consents for CSW to contact [facility].   No other needs at this time.   Signed:  Hoang Reich, LCSW-A 05/18/2024  2:25 PM

## 2024-05-18 NOTE — Plan of Care (Signed)
   Problem: Education: Goal: Knowledge of [place] [facility] Education information/materials will improve Outcome: Progressing Goal: Emotional status will improve Outcome: Progressing Goal: Mental status will improve Outcome: Progressing Goal: Verbalization of understanding the information provided will improve Outcome: Progressing

## 2024-05-18 NOTE — Group Note (Signed)
 Date:  05/18/2024 Time:  3:08 PM  Group Topic/Focus: Social work group- Speaking from the heart Speaking from the Heart is a supportive social work group that provides a safe, respectful space for individuals to share their thoughts, feelings, and lived experiences openly and honestly. The group encourages authentic communication, emotional expression, and mutual understanding, while honoring each persons voice and journey. Through empathy, active listening, and compassion, members are empowered to connect with themselves and others, build emotional awareness, and foster healing, growth, and resilience.    Participation Level:  Did Not Attend   Brett Wu 05/18/2024, 3:08 PM

## 2024-05-18 NOTE — Plan of Care (Signed)
   Problem: Education: Goal: Emotional status will improve Outcome: Not Progressing Goal: Mental status will improve Outcome: Not Progressing   Problem: Activity: Goal: Interest or engagement in activities will improve Outcome: Not Progressing

## 2024-05-18 NOTE — Group Note (Signed)
 Date:  05/18/2024 Time:  10:16 AM  Group Topic/Focus: Goals and orientation Goals Group:   The focus of this group is to help patients establish daily goals to achieve during treatment and discuss how the patient can incorporate goal setting into their daily lives to aide in recovery. Orientation:   The focus of this group is to educate the patient on the purpose and policies of crisis stabilization and provide a format to answer questions about their admission.  The group details unit policies and expectations of patients while admitted.    Participation Level:  Did Not Attend   Brett Wu 05/18/2024, 10:16 AM

## 2024-05-19 ENCOUNTER — Encounter (HOSPITAL_COMMUNITY): Payer: Self-pay

## 2024-05-19 DIAGNOSIS — F19959 Other psychoactive substance use, unspecified with psychoactive substance-induced psychotic disorder, unspecified: Secondary | ICD-10-CM | POA: Diagnosis not present

## 2024-05-19 DIAGNOSIS — F29 Unspecified psychosis not due to a substance or known physiological condition: Secondary | ICD-10-CM | POA: Diagnosis not present

## 2024-05-19 NOTE — Progress Notes (Signed)
 Pt did not attend the social wellness group

## 2024-05-19 NOTE — BH IP Treatment Plan (Signed)
 Interdisciplinary Treatment and Diagnostic Plan Update  05/19/2024 Time of Session: 11:25 AM - UPDATE Brett Wu MRN: [id number]  Principal Diagnosis: Psychosis (HCC)  Secondary Diagnoses: Principal Problem:   Psychosis (HCC)   Current Medications:  Current Facility-Administered Medications  Medication Dose Route Frequency Provider Last Rate Last Admin   acetaminophen  (TYLENOL ) tablet 650 mg  650 mg Oral Q6H PRN White, Patrice L, NP   650 mg at 05/16/24 1951   alum & mag hydroxide-simeth (MAALOX/MYLANTA) 200-200-20 MG/5ML suspension 30 mL  30 mL Oral Q4H PRN White, Patrice L, NP       haloperidol  (HALDOL ) tablet 5 mg  5 mg Oral TID PRN White, Patrice L, NP       And   diphenhydrAMINE  (BENADRYL ) capsule 50 mg  50 mg Oral TID PRN White, Patrice L, NP       haloperidol  lactate (HALDOL ) injection 5 mg  5 mg Intramuscular TID PRN White, Patrice L, NP       And   diphenhydrAMINE  (BENADRYL ) injection 50 mg  50 mg Intramuscular TID PRN White, Patrice L, NP       And   LORazepam  (ATIVAN ) injection 2 mg  2 mg Intramuscular TID PRN White, Patrice L, NP       haloperidol  lactate (HALDOL ) injection 10 mg  10 mg Intramuscular TID PRN White, Patrice L, NP       And   diphenhydrAMINE  (BENADRYL ) injection 50 mg  50 mg Intramuscular TID PRN White, Patrice L, NP       And   LORazepam  (ATIVAN ) injection 2 mg  2 mg Intramuscular TID PRN White, Patrice L, NP       FLUoxetine  (PROZAC ) capsule 20 mg  20 mg Oral Daily [name], MD   20 mg at 05/18/24 1025   hydrOXYzine  (ATARAX ) tablet 25 mg  25 mg Oral TID PRN White, Patrice L, NP   25 mg at 05/18/24 2137   magnesium  hydroxide (MILK OF MAGNESIA) suspension 30 mL  30 mL Oral Daily PRN White, Patrice L, NP       nicotine  (NICODERM CQ  - dosed in mg/24 hours) patch 14 mg  14 mg Transdermal Daily Ajibola, Ene A, NP   14 mg at 05/18/24 1026   OLANZapine  zydis (ZYPREXA ) disintegrating tablet 15 mg  15 mg Oral QHS [name], MD   15 mg at  05/18/24 2137   OLANZapine  zydis (ZYPREXA ) disintegrating tablet 5 mg  5 mg Oral Daily [name], MD   5 mg at 05/18/24 1025   traZODone  (DESYREL ) tablet 50 mg  50 mg Oral QHS PRN White, Patrice L, NP   50 mg at 05/18/24 2137   PTA Medications: No medications prior to admission.    Patient Stressors:    Patient Strengths:    Treatment Modalities: Medication Management, Group therapy, Case management,  1 to 1 session with clinician, Psychoeducation, Recreational therapy.   Physician Treatment Plan for Primary Diagnosis: Psychosis (HCC) Long Term Goal(s):     Short Term Goals: Ability to identify changes in lifestyle to reduce recurrence of condition will improve Ability to identify triggers associated with substance abuse/mental health issues will improve  Medication Management: Evaluate patient's response, side effects, and tolerance of medication regimen.  Therapeutic Interventions: 1 to 1 sessions, Unit Group sessions and Medication administration.  Evaluation of Outcomes: Progressing  Physician Treatment Plan for Secondary Diagnosis: Principal Problem:   Psychosis (HCC)  Long Term Goal(s):     Short Term Goals: Ability  to identify changes in lifestyle to reduce recurrence of condition will improve Ability to identify triggers associated with substance abuse/mental health issues will improve     Medication Management: Evaluate patient's response, side effects, and tolerance of medication regimen.  Therapeutic Interventions: 1 to 1 sessions, Unit Group sessions and Medication administration.  Evaluation of Outcomes: Progressing   RN Treatment Plan for Primary Diagnosis: Psychosis (HCC) Long Term Goal(s): Knowledge of disease and therapeutic regimen to maintain health will improve  Short Term Goals: Ability to remain free from injury will improve, Ability to verbalize frustration and anger appropriately will improve, Ability to verbalize feelings will improve, and  Ability to disclose and discuss suicidal ideas  Medication Management: RN will administer medications as ordered by provider, will assess and evaluate patient's response and provide education to patient for prescribed medication. RN will report any adverse and/or side effects to prescribing provider.  Therapeutic Interventions: 1 on 1 counseling sessions, Psychoeducation, Medication administration, Evaluate responses to treatment, Monitor vital signs and CBGs as ordered, Perform/monitor CIWA, COWS, AIMS and Fall Risk screenings as ordered, Perform wound care treatments as ordered.  Evaluation of Outcomes: Progressing   LCSW Treatment Plan for Primary Diagnosis: Psychosis (HCC) Long Term Goal(s): Safe transition to appropriate next level of care at discharge, Engage patient in therapeutic group addressing interpersonal concerns.  Short Term Goals: Engage patient in aftercare planning with referrals and resources, Increase ability to appropriately verbalize feelings, Facilitate acceptance of mental health diagnosis and concerns, and Identify triggers associated with mental health/substance abuse issues  Therapeutic Interventions: Assess for all discharge needs, 1 to 1 time with Social worker, Explore available resources and support systems, Assess for adequacy in community support network, Educate family and significant other(s) on suicide prevention, Complete Psychosocial Assessment, Interpersonal group therapy.  Evaluation of Outcomes: Progressing   Progress in Treatment: Attending groups: attended some groups Participating in groups: Yes Taking medication as prescribed: no medications have been scheduled for patient at this time. Toleration medication: N/A Family/Significant other contact made: No, will contact:  [facility] *lvm for [facility] Team Lead 1/6 305pm Carolan Brunswick, [phone]) Patient understands diagnosis: No. Discussing patient identified problems/goals  with staff: No. Medical problems stabilized or resolved: Yes. Denies suicidal/homicidal ideation: Yes. Issues/concerns per patient self-inventory: No.   New problem(s) identified:  No   New Short Term/Long Term Goal(s):    medication stabilization, elimination of SI thoughts, development of comprehensive mental wellness plan.    Patient Goals:  patient declined to participate in the treatment team meeting.  Patient had his head covered up and did not respond.   Discharge Plan or Barriers:  Patient recently admitted. CSW will continue to follow and assess for appropriate referrals and possible discharge planning.    Reason for Continuation of Hospitalization: Hallucinations Medication stabilization Suicidal ideation   Estimated Length of Stay:  3 - 4 days  Last 3 [place] Suicide Severity Risk Score: Flowsheet Row ED to Hosp-Admission (Current) [facility] Most recent reading at 05/07/2024  7:36 PM ED from 05/07/2024 in [facility] Department at [facility] Most recent reading at 05/07/2024  7:37 AM ED from 10/22/2021 in [facility] Emergency Department at [facility] Most recent reading at 10/22/2021  6:07 PM  C-SSRS RISK CATEGORY No Risk No Risk No Risk    Last PHQ 2/9 Scores:     No data to display          Scribe for  Treatment Team: Amayrani Bennick O Vilas Edgerly, LCSW 05/19/2024 10:15 AM

## 2024-05-19 NOTE — Progress Notes (Signed)
 Pt did not attend the spiritual wellness group

## 2024-05-19 NOTE — Group Note (Signed)
 Date:  05/19/2024 Time:  10:10 AM  Group Topic/Focus:  Goals Group:   The focus of this group is to help patients establish daily goals to achieve during treatment and discuss how the patient can incorporate goal setting into their daily lives to aide in recovery.    Participation Level:  Did Not Attend  Participation Quality:  na  Affect:  na  Cognitive:  na  Insight: None  Engagement in Group:  None  Modes of Intervention:  na  Additional Comments:  pt did not attend  Nat Rummer 05/19/2024, 10:10 AM

## 2024-05-19 NOTE — Plan of Care (Signed)
" °  Problem: Education: Goal: Knowledge of [facility] Education information/materials will improve Outcome: Progressing Goal: Verbalization of understanding the information provided will improve Outcome: Progressing   Problem: Safety: Goal: Periods of time without injury will increase Outcome: Progressing   Problem: Activity: Goal: Interest or engagement in activities will improve Outcome: Not Progressing   "

## 2024-05-19 NOTE — Progress Notes (Signed)
Pt did not attend pharmacy group.

## 2024-05-19 NOTE — Progress Notes (Signed)
 Pt did not attend rec therapy group

## 2024-05-19 NOTE — Group Note (Signed)
 Date:  05/19/2024 Time:  5:28 PM  Group Topic/Focus: Clean Up Your Sleep Hygiene   Wellness Toolbox:   The focus of this group is to discuss various aspects of wellness, balancing those aspects and exploring ways to increase the ability to experience wellness.  Patients will create a wellness toolbox for use upon discharge.    Participation Level:  Did Not Attend    Inocente PARAS [facility] 05/19/2024, 5:28 PM

## 2024-05-19 NOTE — Progress Notes (Signed)
 Patient refusing to have vitals taken

## 2024-05-19 NOTE — Progress Notes (Signed)
" °   05/19/24 1100  Psych Admission Type (Psych Patients Only)  Admission Status Involuntary  Psychosocial Assessment  Patient Complaints None;Isolation  Eye Contact Avoids (blanket over head)  Facial Expression Other (Comment) (blanket over head)  Affect Flat  Speech Elective mutism  Interaction Poor;No initiation;Isolative  Motor Activity Other (Comment) (UTA, patient in bed with blanket over head)  Appearance/Hygiene Disheveled  Behavior Characteristics Resistant to care  Mood Depressed  Thought Process  Coherency Blocking  Content UTA  Delusions UTA  Perception UTA  Hallucination UTA (patient refusing to answer assessment questions, electively mute, does not appear to be responding)  Judgment Poor  Confusion UTA  Danger to Self  Current suicidal ideation?  (Patient refusing to answer assessment questions)  Danger to Others  Danger to Others None reported or observed    "

## 2024-05-19 NOTE — Group Note (Signed)
 Recreation Therapy Group Note   Group Topic:Communication  Group Date: 05/19/2024 Start Time: 0940 End Time: 1010 Facilitators: Bonnee Zertuche-McCall, LRT,CTRS Location: [address] Dayroom   Group Topic: Communication, Problem Solving   Goal Area(s) Addresses:  Patient will effectively listen to complete activity.  Patient will identify communication skills used to make activity successful.  Patient will identify how skills used during activity can be used to reach post d/c goals.    Behavioral Response:    Intervention: Building Surveyor Activity - Geometric pattern cards, pencils, blank paper    Activity: Geometric Drawings.  Three volunteers from the peer group will be shown an abstract picture with a particular arrangement of geometrical shapes.  Each round, one 'speaker' will describe the pattern, as accurately as possible without revealing the image to the group.  The remaining group members will listen and draw the picture to reflect how it is described to them. Patients with the role of 'listener' cannot ask clarifying questions but, may request that the speaker repeat a direction. Once the drawings are complete, the presenter will show the rest of the group the picture and compare how close each person came to drawing the picture. LRT will facilitate a post-activity discussion regarding effective communication and the importance of planning, listening, and asking for clarification in daily interactions with others.  Education: Environmental consultant, Active listening, Support systems, Discharge planning  Education Outcome: Acknowledges understanding/In group clarification offered/Needs additional education.    Affect/Mood: N/A   Participation Level: Did not attend    Clinical Observations/Individualized Feedback:     Plan: Continue to engage patient in RT group sessions 2-3x/week.   Deklynn Charlet-McCall, LRT,CTRS 05/19/2024 1:02 PM

## 2024-05-19 NOTE — Progress Notes (Signed)
 Brett Wu   Type of Note: [facility] ACTT  Spoke with Garnette Brunswick, ACT team lead [phone] (cell) requesting team to come in and assess patient to determine if he is back at baseline. Garnette states another member of the team who is familiar with patient will give this clinical research associate a call back today to set up a meeting for possibly tomorrow to assess patient.   Will continue to assist.   Signed:  Babbie Dondlinger, LCSW-A 05/19/2024  11:32 AM

## 2024-05-19 NOTE — Plan of Care (Signed)
   Problem: Education: Goal: Emotional status will improve Outcome: Progressing Goal: Mental status will improve Outcome: Progressing   Problem: Activity: Goal: Interest or engagement in activities will improve Outcome: Progressing

## 2024-05-19 NOTE — Group Note (Signed)
 Date:  05/19/2024 Time:  8:36 PM  Group Topic/Focus:  Wrap-Up Group:   The focus of this group is to help patients review their daily goal of treatment and discuss progress on daily workbooks.    Participation Level:  Active  Participation Quality:  Appropriate  Affect:  Appropriate  Cognitive:  Appropriate  Insight: Appropriate  Engagement in Group:  Engaged  Modes of Intervention:  Discussion  Additional Comments:   Pt attended NA meeting  Brett Wu A Brett Wu 05/19/2024, 8:36 PM

## 2024-05-19 NOTE — Progress Notes (Signed)
 [facility] Inpatient Psychiatry Progress Note  Date: 05/19/2024 Patient: Brett Wu MRN: [id number]   ASSESSMENT:  Brett Wu is a 29 y.o. male  with a past psychiatric history of schizophrenia, methamphetamine use, and cannabis use disorder. Patient initially arrived to [facility] on 05/07/24 for increasing auditory hallucinations, delusions, and suicidal ideation and was admitted to [facility] under IVC on 05/07/24 for acute safety concerns and severe substance-induced psychosis or mood disturbances. No significant past medical history.   Patient is a 29 year old male with a past psychiatric history of methamphetamine use disorder, schizophrenia, cannabis use disorder presenting for increasing auditory hallucinations, delusions, and suicidal ideation. Differential diagnosis includes primary psychotic disorder vs substance-induced psychosis vs substance-induced mood disorder. Per chart review, patient has had a good response to Zyprexa  in the past, however had not been compliant with medications outpatient. Plan to continue Zyprexa  given past effectiveness. '  1/7: Patient would not cooperate with interview this morning alongside medical student.  Patient minimally verbal and selectively mute and multiple interactions with this provider or nursing staff.  Currently considering behavioral component versus psychosis.  Patient was appropriate and interactive with provider yesterday and no new medication changes occurred and was not given agitation protocol.  Will continue to assess patient and encourage his participation, if patient still unwilling to engage with providers will consider a room lockout tomorrow versus adjusting psychotropic medications.  Social worker attempted to get in contact with his ACT team yesterday, however was unable to speak with them.  Will need to continue to discuss ACT team about patient's baseline and assist with dispo planning.  Patient's IVC was renewed for  an additional 14 days by the magistrate on 1/6.  PLAN:  # Primary psychotic disorder vs substance-induced psychosis -- Continue with Zyprexa  5 mg daily and 15 mg at bedtime, to limit oversedation in the day time   - Continue Prozac  20 mg daily for suspected depressed mood.   PRN's - Trazodone  50 mg at bedtime as needed for insomnia - Atarax  25 mg TID as needed for anxiety - Agitation Protocol: haldol  + ativan  + benadryl    2. Active Medical Issues   #Nicotine  withdrawal - Patient in need of nicotine  replacement; nicotine  patch 14 mg / 24 hours ordered. Smoking cessation encouraged   Other as needed medications Tylenol  650 mg every 6 hours as needed for pain Mylanta 30 mL every 4 hours as needed for indigestion Milk of magnesia 30 mL daily as needed for constipation   The risks/benefits/side-effects/alternatives to the above medication(s) were discussed in detail with the patient and time was given for questions. The patient consents to medication trial. FDA black box warnings, if present, were discussed.   3. Safety and Monitoring: - Involuntary admission to inpatient psychiatric unit for safety, stabilization and treatment - Daily contact with patient to assess and evaluate symptoms and progress in treatment - Patient's case to be discussed in multi-disciplinary team meeting - Observation Level: q15 minute checks - Vital signs:  q12 hours - Precautions: suicide, elopement, and assault   4. Routine and other pertinent labs: EKG monitoring: QTc: 421 on 05/07/24   Metabolism / endocrine: BMI: Body mass index is 22.15 kg/m.   CBC: WBC 12 CMP: unremarkable UDS: pos for amphetamines Ethanol: <10 TSH: WNL A1c: 5.7% Lipid panel: WNL HIV: negative RPR: pending   Risk Assessment: Patient continues to require inpatient hospitalization for safety and stabilization of acute psychosis.  Discharge Planning: Barriers to Discharge: resolution of psychosis Estimated  Length of  Stay: 3 to 6 days Predicted Discharge Location: group home  INTERVAL HISTORY: No medication refusals.  No agitation events noted.  Only received as needed trazodone  and hydroxyzine  within the last 24 hours.  Has been selectively mute and has lacked some cooperation with interviews with providers and nursing staff.  On my assessment, attempted to engage the patient 2 times this morning.  Both times he was limited in cooperation and would not discuss events of yesterday or mood symptoms this morning.  Patient given multiple opportunities and prompting to engage with this provider, however would not discuss further.   Physical Examination:  Vitals and nursing note reviewed MSK: largely able to move limbs HEENT: Well-healing right sided periorbital contusion, cut above right eye does not appear infectious at this time  MENTAL STATUS EXAM:  Appearance: Patient has covers of his head during the entire encounter   Behavior: Less cooperative today, than on prior assessments   Attitude: Uncooperative, UTA  Speech: Uncooperative, UTA  Mood: Uncooperative, UTA  Affect: flat  Thought Process: Coherent  Thought: Uncooperative, UTA  SI/HI: SI  I dont know, Denies HI   Perceptions: Uncooperative, UTA  Judgement: Poor  Insight: Limited  Fund of Knowledge: Uncooperative, UTA   Lab Results:  Admission on 05/07/2024  Component Date Value Ref Range Status   Neisseria Gonorrhea 05/07/2024 Negative   Final   Chlamydia 05/07/2024 Negative   Final   Comment 05/07/2024 Normal Reference Ranger Chlamydia - Negative   Final   Comment 05/07/2024 Normal Reference Range Neisseria Gonorrhea - Negative   Final   HIV Screen 4th Generation wRfx 05/07/2024 Non Reactive  Non Reactive Final   RPR Ser Ql 05/08/2024 NON REACTIVE  NON REACTIVE Final   Hepatitis B Surface Ag 05/12/2024 NON REACTIVE  NON REACTIVE Final   HCV Ab 05/12/2024 NON REACTIVE  NON REACTIVE Final   Hep A IgM 05/12/2024 NON REACTIVE  NON  REACTIVE Final   Hep B C IgM 05/12/2024 NON REACTIVE  NON REACTIVE Final   Hgb A1c MFr Bld 05/12/2024 5.7 (H)  4.8 - 5.6 % Final   Mean Plasma Glucose 05/12/2024 116.89  mg/dL Final   Cholesterol 87/68/7974 142  0 - 200 mg/dL Final   Triglycerides 87/68/7974 83  <150 mg/dL Final   HDL 87/68/7974 44  >40 mg/dL Final   Total CHOL/HDL Ratio 05/12/2024 3.2  RATIO Final   VLDL 05/12/2024 17  0 - 40 mg/dL Final   LDL Cholesterol 05/12/2024 82  0 - 99 mg/dL Final   RPR Ser Ql 87/68/7974 NON REACTIVE  NON REACTIVE Final   TSH 05/12/2024 0.841  0.350 - 4.500 uIU/mL Final     Vitals: Blood pressure 111/68, pulse 71, temperature 98.3 F (36.8 C), temperature source Oral, resp. rate 15, height 5' 10 (1.778 m), weight 62.6 kg, SpO2 98%.    Signed: Patti Olden, MD [facility] Physician, PGY-2 05/19/2024 8:35 AM

## 2024-05-20 DIAGNOSIS — F19959 Other psychoactive substance use, unspecified with psychoactive substance-induced psychotic disorder, unspecified: Secondary | ICD-10-CM | POA: Diagnosis not present

## 2024-05-20 DIAGNOSIS — F29 Unspecified psychosis not due to a substance or known physiological condition: Secondary | ICD-10-CM | POA: Diagnosis not present

## 2024-05-20 MED ORDER — FLUOXETINE HCL 20 MG PO CAPS
40.0000 mg | ORAL_CAPSULE | Freq: Every day | ORAL | Status: DC
  Administered 2024-05-21 – 2024-05-29 (×8): 40 mg via ORAL
  Filled 2024-05-20 (×8): qty 2

## 2024-05-20 NOTE — [facility] Group Notes (Signed)
 [facility] Group Notes:  (Nursing/MHT/Case Management/Adjunct)  Date:  05/20/2024  Time:  8:31 PM  Type of Therapy:  Wrap up group  Participation Level:    Participation Quality:    Affect:    Cognitive:    Insight:    Engagement in Group:    Modes of Intervention:    Summary of Progress/Problems:  Brett Wu [place] 05/20/2024, 8:31 PM

## 2024-05-20 NOTE — Progress Notes (Signed)
 Brett Wu   Type of Note: ACTT meeting   This clinical research associate, patient, and [name] on [facility] ACT team met from 1140 am-1230 pm.  Rosaline states patient is still not close to baseline, reports she has worked with him for the past 4.5 years and has never seen him this hopeless and depressed. She did notice a decline around October stating his depression was getting worse. States patient is typically more forthcoming with information and constantly joking and smiling. Pt did engage some in conversation and inquire about what it would take to be discharged, hoping that Rosaline could take him with her when she left.   Rosaline asked patient about his black eye and having stitches, pt stated he did not want to talk about it, which, according to Rosaline, is not like him. Pt was also fixated that he was going to be arrested and locked up as soon as he returns home but refused to elaborate on what happened to cause this. Rosaline states to her knowledge, the pt does not have any current police involvement.   Patient states feeling exhausted and drained, constantly worrying about his family and making sure people around him are doing well and never putting himself first.   Rosaline did state that patient is somewhat paranoid at his baseline, but at this time she is unsure if pt believing he did something to get arrested is reality or not.   Patient lost both of his parents within a few months of each other earlier last year. Pt has a good relationship with his grandfather, brother and sister as well as his nieces and nephews. Pt spends time at his grandfathers often or out with friends.   Patient did not disclose if he was suicidal despite Rosaline asking several times.   Rosaline did confirm to this clinical research associate and to patient that he still has housing at his apartment and they will continue to follow up with him post discharge.   Rosaline is open to meeting with patient again during  hospitalization to see if he has made more progress.   Signed:  Yeraldy Spike, LCSW-A 05/20/2024  1:24 PM

## 2024-05-20 NOTE — [facility] Group Notes (Signed)
 Pt did not attend CSW group (05/20/24, 1100-1200)

## 2024-05-20 NOTE — Group Note (Signed)
 Date:  05/20/2024 Time:  9:18 AM  Group Topic/Focus: Goals Group  Patients were provided SMART goals worksheets and encouraged to introduce themselves using an icebreaker (What animal were you in a past life?). Patients then shared their SMART goals with the group, which promoted peer support, encouragement, and positive socialization.   Participation Level:  Did Not Attend  Additional Comments:  Pt did not attend goals group  Kristi HERO [facility] 05/20/2024, 9:18 AM

## 2024-05-20 NOTE — Progress Notes (Signed)
 Patient ID: Brett Wu, male   DOB: 1995-09-15, 29 y.o.   MRN: [id number] Writer tried to get pt to get up for vitals and meals today. Pt refused to designer, multimedia. RN notified.

## 2024-05-20 NOTE — Progress Notes (Signed)
 Pt required multiple prompts to get out of bed. Pt initially remained in bed with blanket covering self and refused to respond to questions asked. Pt accepted medication late once out of bed. Pt  minimal in interactions, denies SI/HI/AVH at this time.

## 2024-05-20 NOTE — Group Note (Signed)
 LCSW Group Therapy Note   Group Date: 05/20/2024 Start Time: 1100 End Time: 1200   Participation:  did not attend  Type of Therapy:  Group Therapy   Topic:  Stronger Together:  Building Healthy Relationships  Objective:  To explore loneliness, boundaries, and safe ways to build relationships.  Goals: Recognize healthy vs. unhealthy relationships. Learn safe ways to connect with others. Strengthen communication and murphy oil.  Summary:  Participants discussed loneliness, healthy connections, and setting boundaries. They explored safe ways to meet people and shared personal experiences. Key insights were reinforced through discussion and quotes.  Therapeutic Modalities Used: - Cognitive Behavioral Therapy (CBT) Elements - Identifying unhealthy relationship patterns, challenging negative thoughts about connection. - Dialectical Behavior Therapy (DBT) Elements - Interpersonal effectiveness, setting and maintaining boundaries. - Supportive Group Therapy - Peer discussion, shared experiences, and emotional validation.   Bakari Nikolai O Kendrix Orman, LCSWA 05/20/2024  12:31 PM

## 2024-05-20 NOTE — [facility] Group Notes (Signed)
 Pt did not attend the nutrition group (05/20/24, [id number])

## 2024-05-20 NOTE — [facility] Group Notes (Signed)
 Pt did not attend occupational therapy group (05/20/24, 1500-1540)

## 2024-05-20 NOTE — Plan of Care (Signed)

## 2024-05-20 NOTE — Plan of Care (Signed)
  Problem: Education: Goal: Mental status will improve Outcome: Progressing   

## 2024-05-20 NOTE — Progress Notes (Signed)
(  Sleep Hours) -6.75 (Any PRNs that were needed, meds refused, or side effects to meds)- Atarax , and Trazodone  (Any disturbances and when (visitation, over night)-none (Concerns raised by the patient)- none (SI/HI/AVH)-denied

## 2024-05-20 NOTE — Progress Notes (Signed)
 [facility] Inpatient Psychiatry Progress Note  Date: 05/20/2024 Patient: Brett Wu MRN: [id number]   ASSESSMENT:  BLAKELEY MARGRAF is a 29 y.o. male  with a past psychiatric history of schizophrenia, methamphetamine use, and cannabis use disorder. Patient initially arrived to [facility] on 05/07/24 for increasing auditory hallucinations, delusions, and suicidal ideation and was admitted to [facility] under IVC on 05/07/24 for acute safety concerns and severe substance-induced psychosis or mood disturbances. No significant past medical history.   Patient is a 29 year old male with a past psychiatric history of methamphetamine use disorder, schizophrenia, cannabis use disorder presenting for increasing auditory hallucinations, delusions, and suicidal ideation. Differential diagnosis includes primary psychotic disorder vs substance-induced psychosis vs substance-induced mood disorder. Per chart review, patient has had a good response to Zyprexa  in the past, however had not been compliant with medications outpatient. Plan to continue Zyprexa  given past effectiveness. '  1/8: Patient much more engaged and cooperative with the provider this morning.  Was able to actually get the patient to leave his bedroom and walk on the unit and conducted an interview with the patient.  Suspect patient's selective mutism could be related to the improvement in psychosis versus increased fatigue and somnolence in the setting of substance induced withdrawal.  Patient has been admitted for more than 10 days and this would be an extremely prolonged withdrawal period If he was still experiencing those issues.  He continues to report some symptoms of depression and anxiety, rating depression a 5 out of 10.  He denies any suicidal ideations for the first time with this provider, homicidal ideations or current auditory visual hallucinations.  Patient amenable with increasing Prozac  to 40 mg daily to help address  depressive symptoms.  Patient is tentatively supposed to meet with his ACT team today to assess for return to baseline.  Will continue to engage with ACT team to figure out best disposition planning.  Patient is currently under IVC which was recently renewed for an additional 14 days for the magistrate.  PLAN:  # Primary psychotic disorder vs substance-induced psychosis -- Continue with Zyprexa  5 mg daily and 15 mg at bedtime, to limit oversedation in the day time   - Increase Prozac  to 40 mg daily daily for suspected depressed mood.   PRN's - Trazodone  50 mg at bedtime as needed for insomnia - Atarax  25 mg TID as needed for anxiety - Agitation Protocol: haldol  + ativan  + benadryl    2. Active Medical Issues   #Nicotine  withdrawal - Patient in need of nicotine  replacement; nicotine  patch 14 mg / 24 hours ordered. Smoking cessation encouraged   Other as needed medications Tylenol  650 mg every 6 hours as needed for pain Mylanta 30 mL every 4 hours as needed for indigestion Milk of magnesia 30 mL daily as needed for constipation   The risks/benefits/side-effects/alternatives to the above medication(s) were discussed in detail with the patient and time was given for questions. The patient consents to medication trial. FDA black box warnings, if present, were discussed.   3. Safety and Monitoring: - Involuntary admission to inpatient psychiatric unit for safety, stabilization and treatment - Daily contact with patient to assess and evaluate symptoms and progress in treatment - Patient's case to be discussed in multi-disciplinary team meeting - Observation Level: q15 minute checks - Vital signs:  q12 hours - Precautions: suicide, elopement, and assault   4. Routine and other pertinent labs: EKG monitoring: QTc: 421 on 05/07/24   Metabolism / endocrine: BMI: Body  mass index is 22.15 kg/m.   CBC: WBC 12 CMP: unremarkable UDS: pos for amphetamines Ethanol: <10 TSH: WNL A1c:  5.7% Lipid panel: WNL HIV: negative RPR: Nonreactive   Risk Assessment: Patient continues to require inpatient hospitalization for safety and stabilization of acute psychosis.  Discharge Planning: Barriers to Discharge: resolution of psychosis, assessment by ACT team Estimated Length of Stay: To be determined Predicted Discharge Location: group home  INTERVAL HISTORY: No medication refusals.  No agitation events noted.  Only received as needed trazodone  and hydroxyzine  within the last 24 hours.    On my assessment, she was much more interactive today.  And engaged in interview, even about this provider to speak with him on the unit.  He does report some ongoing depression and anxiety.  He denies suicidal ideations, homicidal ideations, or current auditory visual hallucinations.  Patient reports at times when he uses meth he does experience hallucinations and experiences that, the dark around.  Continue to encourage patient to get out of bed and engage on the unit as to work towards goals of discharge.  Patient voiced understanding.  Physical Examination:  Vitals and nursing note reviewed MSK: largely able to move limbs HEENT: Well-healing right sided periorbital contusion, cut above right eye does not appear infectious at this time  MENTAL STATUS EXAM:  Appearance: Patient is a older than stated age white male, with disheveled hair and beard  Behavior: Appropriate, cooperative, engaged  Attitude: Engaged  Speech: Normal rate, normal volume  Mood: Okay  Affect: flat  Thought Process: Coherent, logical    SI/HI: Denies both  Perceptions: Denies auditory, visual hallucinations  Judgement: Poor  Insight: Limited  Fund of Knowledge: Limited   Lab Results:  Admission on 05/07/2024  Component Date Value Ref Range Status   Neisseria Gonorrhea 05/07/2024 Negative   Final   Chlamydia 05/07/2024 Negative   Final   Comment 05/07/2024 Normal Reference Ranger Chlamydia - Negative    Final   Comment 05/07/2024 Normal Reference Range Neisseria Gonorrhea - Negative   Final   HIV Screen 4th Generation wRfx 05/07/2024 Non Reactive  Non Reactive Final   RPR Ser Ql 05/08/2024 NON REACTIVE  NON REACTIVE Final   Hepatitis B Surface Ag 05/12/2024 NON REACTIVE  NON REACTIVE Final   HCV Ab 05/12/2024 NON REACTIVE  NON REACTIVE Final   Hep A IgM 05/12/2024 NON REACTIVE  NON REACTIVE Final   Hep B C IgM 05/12/2024 NON REACTIVE  NON REACTIVE Final   Hgb A1c MFr Bld 05/12/2024 5.7 (H)  4.8 - 5.6 % Final   Mean Plasma Glucose 05/12/2024 116.89  mg/dL Final   Cholesterol 87/68/7974 142  0 - 200 mg/dL Final   Triglycerides 87/68/7974 83  <150 mg/dL Final   HDL 87/68/7974 44  >40 mg/dL Final   Total CHOL/HDL Ratio 05/12/2024 3.2  RATIO Final   VLDL 05/12/2024 17  0 - 40 mg/dL Final   LDL Cholesterol 05/12/2024 82  0 - 99 mg/dL Final   RPR Ser Ql 87/68/7974 NON REACTIVE  NON REACTIVE Final   TSH 05/12/2024 0.841  0.350 - 4.500 uIU/mL Final     Vitals: Blood pressure (!) 109/59, pulse 78, temperature 98.3 F (36.8 C), temperature source Oral, resp. rate 15, height 5' 10 (1.778 m), weight 62.6 kg, SpO2 98%.    Signed: Patti Olden, MD [facility] Physician, PGY-2 05/20/2024 8:31 AM

## 2024-05-20 NOTE — Progress Notes (Signed)
 Brett Wu   Type of Note: [facility] f/u  Called and spoke with Garnette [phone]) this morning and confirmed that [name] who is a part of ACT for patient will be here this morning at 1130 AM to meet with patient.   Signed:  Antjuan Rothe, LCSW-A 05/20/2024  9:15 AM

## 2024-05-21 DIAGNOSIS — F29 Unspecified psychosis not due to a substance or known physiological condition: Secondary | ICD-10-CM | POA: Diagnosis not present

## 2024-05-21 DIAGNOSIS — F19959 Other psychoactive substance use, unspecified with psychoactive substance-induced psychotic disorder, unspecified: Secondary | ICD-10-CM | POA: Diagnosis not present

## 2024-05-21 MED ORDER — OLANZAPINE 5 MG PO TBDP
ORAL_TABLET | ORAL | Status: AC
  Filled 2024-05-21: qty 3

## 2024-05-21 NOTE — Progress Notes (Signed)
" °   05/21/24 1400  Psych Admission Type (Psych Patients Only)  Admission Status Involuntary  Psychosocial Assessment  Patient Complaints None  Eye Contact Fair  Facial Expression Flat  Affect Flat  Speech Soft  Interaction Minimal  Motor Activity Slow  Appearance/Hygiene Disheveled  Behavior Characteristics Guarded  Mood Anxious  Thought Process  Coherency Blocking  Content WDL  Delusions None reported or observed  Perception WDL  Hallucination None reported or observed  Judgment Impaired  Confusion None  Danger to Self  Current suicidal ideation? Denies  Danger to Others  Danger to Others None reported or observed    "

## 2024-05-21 NOTE — Group Note (Signed)
 Date:  05/21/2024 Time:  1:00 PM  Group Topic/Focus:   Social Wellness:   The focus of this group is to discuss boundaries, challenges in implementing them, as well as healthy ways to communicate boundaries with others.     Participation Level:  Minimal  Participation Quality:  Appropriate  Affect:  Appropriate  Cognitive:  Appropriate  Insight: Appropriate  Engagement in Group:  Developing/Improving  Modes of Intervention:  Discussion and Education  Additional Comments:    Sayana Salley D Davi Kroon 05/21/2024, 1:00 PM

## 2024-05-21 NOTE — Group Note (Signed)
 Date:  05/21/2024 Time:  1:29 PM  Group Topic/Focus:  Goals Group:   The focus of this group is to help patients establish daily goals to achieve during treatment and discuss how the patient can incorporate goal setting into their daily lives to aide in recovery. Orientation:   The focus of this group is to educate the patient on the purpose and policies of crisis stabilization and provide a format to answer questions about their admission.  The group details unit policies and expectations of patients while admitted.    Participation Level:  Did Not Attend   Brett Wu 05/21/2024, 1:29 PM

## 2024-05-21 NOTE — Progress Notes (Signed)
(  Sleep Hours) -8.25 (Any PRNs that were needed, meds refused, or side effects to meds)- tylenol  and Trazodone   (Any disturbances and when (visitation, over night)- (Concerns raised by the patient)-  (SI/HI/AVH)-

## 2024-05-21 NOTE — Group Note (Signed)
 Recreation Therapy Group Note   Group Topic:Problem Solving  Group Date: 05/21/2024 Start Time: 0935 End Time: 1003 Facilitators: Danaria Larsen-McCall, LRT,CTRS Location: [address] Dayroom   Group Topic: Communication, Team Building, Problem Solving  Goal Area(s) Addresses:  Patient will effectively work with peer towards shared goal.  Patient will identify skills used to make activity successful.  Patient will identify how skills used during activity can be used to reach post d/c goals.   Behavioral Response:   Intervention: STEM Activity  Activity: Straw Bridge. In teams of 3-5, patients were given 15 plastic drinking straws and an equal length of masking tape. Using the materials provided, patients were instructed to build a free standing bridge-like structure to suspend an everyday item (ex: puzzle box) off of the floor or table surface. All materials were required to be used by the team in their design. LRT facilitated post-activity discussion reviewing team process. Patients were encouraged to reflect how the skills used in this activity can be generalized to daily life post discharge.   Education: Pharmacist, Community, Scientist, Physiological, Discharge Planning   Education Outcome: Acknowledges education/In group clarification offered/Needs additional education.    Affect/Mood: N/A   Participation Level: Did not attend    Clinical Observations/Individualized Feedback:     Plan: Continue to engage patient in RT group sessions 2-3x/week.   Aldon Hengst-McCall, LRT,CTRS 05/21/2024 1:11 PM

## 2024-05-21 NOTE — Group Note (Signed)
 Date:  05/21/2024 Time:  1:46 PM  Group Topic/Focus:   Recreational Therapy:     Participation Level:  Did Not Attend    Brett Wu 05/21/2024, 1:46 PM

## 2024-05-21 NOTE — Progress Notes (Signed)
 [facility] Inpatient Psychiatry Progress Note  Date: 05/21/2024 Patient: Brett Wu MRN: [id number]   ASSESSMENT:  Brett Wu is a 29 y.o. male  with a past psychiatric history of schizophrenia, methamphetamine use, and cannabis use disorder. Patient initially arrived to [facility] on 05/07/24 for increasing auditory hallucinations, delusions, and suicidal ideation and was admitted to [facility] under IVC on 05/07/24 for acute safety concerns and severe substance-induced psychosis or mood disturbances. No significant past medical history.   Patient is a 29 year old male with a past psychiatric history of methamphetamine use disorder, schizophrenia, cannabis use disorder presenting for increasing auditory hallucinations, delusions, and suicidal ideation. Differential diagnosis includes primary psychotic disorder vs substance-induced psychosis vs substance-induced mood disorder. Per chart review, patient has had a good response to Zyprexa  in the past, however had not been compliant with medications outpatient. Plan to continue Zyprexa  given past effectiveness.   1/9: Continue to be more engaged and cooperative with this provider and walked with me on the unit during her interview.  Suspect patient's selective mutism could be related to the improvement in psychosis versus increased fatigue and somnolence in the setting of substance induced withdrawal.  Patient has been admitted for more than 10 days and this would be an extremely prolonged withdrawal period If he was still experiencing those issues.  Patient tolerating Prozac  40 mg daily to help address depressive symptoms, that he does think it makes him feel more activated.   Chart review patient was seen by his ACT team yesterday and they felt the patient was not at his baseline.  We will continue to involve them in the disposition planning, as the patient has done better with me the last 2 days.  PLAN:  # Primary psychotic disorder  vs substance-induced psychosis -- Continue with Zyprexa  5 mg daily and 15 mg at bedtime, to limit oversedation in the day time   - Continue Prozac  to 40 mg daily daily for suspected depressed mood.   PRN's - Trazodone  50 mg at bedtime as needed for insomnia - Atarax  25 mg TID as needed for anxiety - Agitation Protocol: haldol  + ativan  + benadryl    2. Active Medical Issues   #Nicotine  withdrawal - Patient in need of nicotine  replacement; nicotine  patch 14 mg / 24 hours ordered. Smoking cessation encouraged   Other as needed medications Tylenol  650 mg every 6 hours as needed for pain Mylanta 30 mL every 4 hours as needed for indigestion Milk of magnesia 30 mL daily as needed for constipation   The risks/benefits/side-effects/alternatives to the above medication(s) were discussed in detail with the patient and time was given for questions. The patient consents to medication trial. FDA black box warnings, if present, were discussed.   3. Safety and Monitoring: - Involuntary admission to inpatient psychiatric unit for safety, stabilization and treatment - Daily contact with patient to assess and evaluate symptoms and progress in treatment - Patient's case to be discussed in multi-disciplinary team meeting - Observation Level: q15 minute checks - Vital signs:  q12 hours - Precautions: suicide, elopement, and assault   4. Routine and other pertinent labs: EKG monitoring: QTc: 421 on 05/07/24   Metabolism / endocrine: BMI: Body mass index is 22.15 kg/m.   CBC: WBC 12 CMP: unremarkable UDS: pos for amphetamines Ethanol: <10 TSH: WNL A1c: 5.7% Lipid panel: WNL HIV: negative RPR: Nonreactive   Risk Assessment: Patient continues to require inpatient hospitalization for safety and stabilization of acute psychosis.  Discharge Planning: Barriers to  Discharge: resolution of psychosis, assessment by ACT team Estimated Length of Stay: To be determined Predicted Discharge Location:  group home  INTERVAL HISTORY: No medication refusals.  No agitation events noted.  Only received as needed trazodone  and tylenol  within the last 24 hours.    On my assessment, patient continues to be interactive with this provider.  He engaged in conversation and allow this provider to walk him to the day room afterwards.  Patient reports that at times he is tired on the unit of all the disruptions in his day and constant monitoring.  Patient also reports that he suspects that his meth use caused him to be overly tired and due to withdrawal symptoms.  Patient denies any severe depression or anxiety this morning.  Denies suicidal ideations, homicidal ideations auditory visual hallucinations.  Patient reports no difficulties with tolerating the increased Prozac  dose, however he does state that he does feel more energized..  Patient denies any issues with appetite or sleep.  Courage patient to continue to get out on the unit and work towards goals of discharge, patient voiced understanding and will attempt to be more engaged.  He did report that he did go to some groups yesterday and thought that they were helpful.  Physical Examination:  Vitals and nursing note reviewed MSK: largely able to move limbs HEENT: Well-healing right sided periorbital contusion, cut above right eye does not appear infectious at this time  MENTAL STATUS EXAM:  Appearance: Patient is a older than stated age white male, with disheveled hair and beard  Behavior: Appropriate, cooperative, engaged  Attitude: Engaged  Speech: Normal rate, normal volume  Mood: Okay  Affect: flat, full range of affect, some moments of laughter displayed  Thought Process: Coherent, logical    SI/HI: Denies both  Perceptions: Denies auditory, visual hallucinations  Judgement: Poor  Insight: Limited  Fund of Knowledge: Limited   Lab Results:  Admission on 05/07/2024  Component Date Value Ref Range Status   Neisseria Gonorrhea 05/07/2024  Negative   Final   Chlamydia 05/07/2024 Negative   Final   Comment 05/07/2024 Normal Reference Ranger Chlamydia - Negative   Final   Comment 05/07/2024 Normal Reference Range Neisseria Gonorrhea - Negative   Final   HIV Screen 4th Generation wRfx 05/07/2024 Non Reactive  Non Reactive Final   RPR Ser Ql 05/08/2024 NON REACTIVE  NON REACTIVE Final   Hepatitis B Surface Ag 05/12/2024 NON REACTIVE  NON REACTIVE Final   HCV Ab 05/12/2024 NON REACTIVE  NON REACTIVE Final   Hep A IgM 05/12/2024 NON REACTIVE  NON REACTIVE Final   Hep B C IgM 05/12/2024 NON REACTIVE  NON REACTIVE Final   Hgb A1c MFr Bld 05/12/2024 5.7 (H)  4.8 - 5.6 % Final   Mean Plasma Glucose 05/12/2024 116.89  mg/dL Final   Cholesterol 87/68/7974 142  0 - 200 mg/dL Final   Triglycerides 87/68/7974 83  <150 mg/dL Final   HDL 87/68/7974 44  >40 mg/dL Final   Total CHOL/HDL Ratio 05/12/2024 3.2  RATIO Final   VLDL 05/12/2024 17  0 - 40 mg/dL Final   LDL Cholesterol 05/12/2024 82  0 - 99 mg/dL Final   RPR Ser Ql 87/68/7974 NON REACTIVE  NON REACTIVE Final   TSH 05/12/2024 0.841  0.350 - 4.500 uIU/mL Final     Vitals: Blood pressure 111/68, pulse 87, temperature 98.3 F (36.8 C), temperature source Oral, resp. rate 15, height 5' 10 (1.778 m), weight 62.6 kg, SpO2 98%.  Signed: Patti Olden, MD [facility] Physician, PGY-2 05/21/2024 8:24 AM

## 2024-05-21 NOTE — Plan of Care (Signed)

## 2024-05-21 NOTE — Plan of Care (Signed)
   Problem: Education: Goal: Emotional status will improve Outcome: Progressing Goal: Mental status will improve Outcome: Progressing

## 2024-05-21 NOTE — Group Note (Signed)
 Date:  05/21/2024 Time:  6:41 PM  Group Topic/Focus: The diet's effect on mental wellness. Self Care:   The focus of this group is to help patients understand the importance of self-care in order to improve or restore emotional, physical, spiritual, interpersonal, and financial health.      Participation Level:  Did Not Attend    Brett Wu 05/21/2024, 6:41 PM

## 2024-05-22 DIAGNOSIS — F29 Unspecified psychosis not due to a substance or known physiological condition: Secondary | ICD-10-CM | POA: Diagnosis not present

## 2024-05-22 DIAGNOSIS — F19959 Other psychoactive substance use, unspecified with psychoactive substance-induced psychotic disorder, unspecified: Secondary | ICD-10-CM | POA: Diagnosis not present

## 2024-05-22 MED ORDER — BACITRACIN-NEOMYCIN-POLYMYXIN OINTMENT TUBE
TOPICAL_OINTMENT | CUTANEOUS | Status: DC | PRN
  Administered 2024-05-26: 1 via TOPICAL
  Filled 2024-05-22: qty 14.17

## 2024-05-22 NOTE — Progress Notes (Cosign Needed)
 [facility] Inpatient Psychiatry Progress Note  Date: 05/22/2024 Patient: Brett Wu MRN: [id number]   ASSESSMENT:  Brett Wu is a 29 y.o. male  with a past psychiatric history of schizophrenia, methamphetamine use, and cannabis use disorder. Patient initially arrived to [facility] on 05/07/24 for increasing auditory hallucinations, delusions, and suicidal ideation and was admitted to [facility] under IVC on 05/07/24 for acute safety concerns and severe substance-induced psychosis or mood disturbances. Per chart review, patient has had a good response to Zyprexa  in the past, however had not been compliant with medications outpatient. Continued zyprexa  given the effectiveness despite poor adherence. He has been very isolated in room throughout hospital and was seen by ACTT on 1/9 who stated he was not at baseline, despite psychiatric team feeling he is nearing baseline and clearing psychiatrically.   05/22/2024 Refused to talk today and very minimal. Continue to assess and likely not make changes. More likely will be re-assessed by primary team on Monday including involvement of ACTT v discharging as there may be a strong volitional component to minimal responses.   PLAN:  # Primary psychotic disorder vs substance-induced psychosis -- Continue with Zyprexa  5 mg daily and 15 mg at bedtime, to limit oversedation in the day time   - Continue Prozac  to 40 mg daily daily for suspected depressed mood.   PRN's - Trazodone  50 mg at bedtime as needed for insomnia - Atarax  25 mg TID as needed for anxiety - Agitation Protocol: haldol  + ativan  + benadryl    2. Active Medical Issues   #Nicotine  withdrawal - Patient in need of nicotine  replacement; nicotine  patch 14 mg / 24 hours ordered. Smoking cessation encouraged   Other as needed medications Tylenol  650 mg every 6 hours as needed for pain Mylanta 30 mL every 4 hours as needed for indigestion Milk of magnesia 30 mL daily as  needed for constipation   The risks/benefits/side-effects/alternatives to the above medication(s) were discussed in detail with the patient and time was given for questions. The patient consents to medication trial. FDA black box warnings, if present, were discussed.   3. Safety and Monitoring: - Involuntary admission to inpatient psychiatric unit for safety, stabilization and treatment - Daily contact with patient to assess and evaluate symptoms and progress in treatment - Patient's case to be discussed in multi-disciplinary team meeting - Observation Level: q15 minute checks - Vital signs:  q12 hours - Precautions: suicide, elopement, and assault   4. Routine and other pertinent labs: EKG monitoring: QTc: 421 on 05/07/24   Metabolism / endocrine: BMI: Body mass index is 22.15 kg/m.   CBC: WBC 12 CMP: unremarkable UDS: pos for amphetamines Ethanol: <10 TSH: WNL A1c: 5.7% Lipid panel: WNL HIV: negative RPR: Nonreactive   Risk Assessment: Patient continues to require inpatient hospitalization for safety and stabilization of acute psychosis.  Discharge Planning: Barriers to Discharge: resolution of psychosis, assessment by ACT team Estimated Length of Stay: To be determined Predicted Discharge Location: group home  INTERVAL HISTORY: No medication refusals.  No agitation events noted.  Only received as needed trazodone  and tylenol  within the last 24 hours.    On my assessment, patient continues to be interactive with this provider.  He engaged in conversation and allow this provider to walk him to the day room afterwards.  Patient reports that at times he is tired on the unit of all the disruptions in his day and constant monitoring.  Patient also reports that he suspects that his meth  use caused him to be overly tired and due to withdrawal symptoms.  Patient denies any severe depression or anxiety this morning.  Denies suicidal ideations, homicidal ideations auditory visual  hallucinations.  Patient reports no difficulties with tolerating the increased Prozac  dose, however he does state that he does feel more energized..  Patient denies any issues with appetite or sleep.  Courage patient to continue to get out on the unit and work towards goals of discharge, patient voiced understanding and will attempt to be more engaged.  He did report that he did go to some groups yesterday and thought that they were helpful.  Physical Examination:  Vitals and nursing note reviewed MSK: largely able to move limbs HEENT: Well-healing right sided periorbital contusion, cut above right eye does not appear infectious at this time  MENTAL STATUS EXAM:  Appearance: bed sheet over head  Behavior: wouldn't response, uncooperative  Attitude: refusing  Speech: no speech   Mood: would not asnwer  Affect: sheet over head  Thought Process: n/a  Thought Content: n/a  SI/HI: n/a  Perceptions: n/a  Judgement: Poor  Insight: poor  Fund of Knowledge: Limited   Lab Results:  Admission on 05/07/2024  Component Date Value Ref Range Status   Neisseria Gonorrhea 05/07/2024 Negative   Final   Chlamydia 05/07/2024 Negative   Final   Comment 05/07/2024 Normal Reference Ranger Chlamydia - Negative   Final   Comment 05/07/2024 Normal Reference Range Neisseria Gonorrhea - Negative   Final   HIV Screen 4th Generation wRfx 05/07/2024 Non Reactive  Non Reactive Final   RPR Ser Ql 05/08/2024 NON REACTIVE  NON REACTIVE Final   Hepatitis B Surface Ag 05/12/2024 NON REACTIVE  NON REACTIVE Final   HCV Ab 05/12/2024 NON REACTIVE  NON REACTIVE Final   Hep A IgM 05/12/2024 NON REACTIVE  NON REACTIVE Final   Hep B C IgM 05/12/2024 NON REACTIVE  NON REACTIVE Final   Hgb A1c MFr Bld 05/12/2024 5.7 (H)  4.8 - 5.6 % Final   Mean Plasma Glucose 05/12/2024 116.89  mg/dL Final   Cholesterol 87/68/7974 142  0 - 200 mg/dL Final   Triglycerides 87/68/7974 83  <150 mg/dL Final   HDL 87/68/7974 44  >40 mg/dL  Final   Total CHOL/HDL Ratio 05/12/2024 3.2  RATIO Final   VLDL 05/12/2024 17  0 - 40 mg/dL Final   LDL Cholesterol 05/12/2024 82  0 - 99 mg/dL Final   RPR Ser Ql 87/68/7974 NON REACTIVE  NON REACTIVE Final   TSH 05/12/2024 0.841  0.350 - 4.500 uIU/mL Final     Vitals: Blood pressure 100/61, pulse 74, temperature 98.3 F (36.8 C), temperature source Oral, resp. rate 15, height 5' 10 (1.778 m), weight 62.6 kg, SpO2 97%.    Justino Cornish, MD PGY-2 Psychiatry Resident 05/22/2024, 3:52 PM

## 2024-05-22 NOTE — BH Assessment (Addendum)
(  Sleep Hours) - 7.75 (Any PRNs that were needed, meds refused, or side effects to meds)-  (Any disturbances and when (visitation, over night)- None (Concerns raised by the patient)- None (SI/HI/AVH)- Denies

## 2024-05-22 NOTE — Progress Notes (Signed)
 Pt isolative to room. Pt minimal with interaction with staff. Pt prompted for medication multiple times today and would not respond to nurse. Nurse brought medications to pt and pt accepted medication at that time.

## 2024-05-22 NOTE — Plan of Care (Signed)

## 2024-05-22 NOTE — Group Note (Signed)
 Date:  05/22/2024 Time:  9:26 AM  Group Topic/Focus:  Goals Group:   The focus of this group is to help patients establish daily goals to achieve during treatment and discuss how the patient can incorporate goal setting into their daily lives to aide in recovery. Orientation:   The focus of this group is to educate the patient on the purpose and policies of crisis stabilization and provide a format to answer questions about their admission.  The group details unit policies and expectations of patients while admitted.    Participation Level:  Did Not Attend

## 2024-05-22 NOTE — Group Note (Signed)
"                                                 [facility] LCSW Group Therapy Note    Group Date: 05/22/2024 Start Time: 1000 End Time: 1100  Type of Therapy and Topic:  Group Therapy:  Overcoming Obstacles  Participation Level:  [facility] PARTICIPATION LEVEL: Did Not Attend  Mood:  Description of Group:   In this group patients will be encouraged to explore what they see as obstacles to their own wellness and recovery. They will be guided to discuss their thoughts, feelings, and behaviors related to these obstacles. The group will process together ways to cope with barriers, with attention given to specific choices patients can make. Each patient will be challenged to identify changes they are motivated to make in order to overcome their obstacles. This group will be process-oriented, with patients participating in exploration of their own experiences as well as giving and receiving support and challenge from other group members.  Therapeutic Goals: 1. Patient will identify personal and current obstacles as they relate to admission. 2. Patient will identify barriers that currently interfere with their wellness or overcoming obstacles.  3. Patient will identify feelings, thought process and behaviors related to these barriers. 4. Patient will identify two changes they are willing to make to overcome these obstacles:    Summary of Patient Progress   NA   Therapeutic Modalities:   Cognitive Behavioral Therapy Solution Focused Therapy Motivational Interviewing Relapse Prevention Therapy   Viola Kinnick O Denasia Venn, LCSWA "

## 2024-05-22 NOTE — [facility] Group Notes (Signed)
 Adult Psychoeducational Group Note  Date:  05/22/2024 Time:  11:12 PM  Group Topic/Focus:  Wrap-Up Group:   The focus of this group is to help patients review their daily goal of treatment and discuss progress on daily workbooks.  Participation Level:  Did Not Attend  Participation Quality:  did not attend  Affect:  did not attend  Cognitive:  did not attend  Insight: None  Engagement in Group:  did not attend  Modes of Intervention:  did not attend  Additional Comments:   Did not attend  Lang Drilling Long 05/22/2024, 11:12 PM

## 2024-05-22 NOTE — Plan of Care (Signed)

## 2024-05-22 NOTE — Group Note (Signed)
 Date:  05/22/2024 Time:  6:20 PM  Group Topic/Focus:  Addiction and how our neurotransmitters are affected and the effects of them are manifested. Alternatives to getting illicit dopamine hits, natural ways to do so.   Participation Level:  Did Not Attend   Brett Wu 05/22/2024, 6:20 PM

## 2024-05-23 DIAGNOSIS — F19959 Other psychoactive substance use, unspecified with psychoactive substance-induced psychotic disorder, unspecified: Secondary | ICD-10-CM | POA: Diagnosis not present

## 2024-05-23 DIAGNOSIS — F17203 Nicotine dependence unspecified, with withdrawal: Secondary | ICD-10-CM

## 2024-05-23 DIAGNOSIS — F29 Unspecified psychosis not due to a substance or known physiological condition: Secondary | ICD-10-CM | POA: Diagnosis not present

## 2024-05-23 NOTE — Progress Notes (Signed)
 Patient has stayed in bed all day.  Refused VS and refused medications.  Patient did deny SI and HI.  Denied A/V hallucinations while laying in bed.   Encouragement and support has been offered by staff throughout the day. Safety maintained with 15 minute checks.

## 2024-05-23 NOTE — Plan of Care (Signed)
   Problem: Education: Goal: Knowledge of [facility] Education information/materials will improve Outcome: Progressing   Problem: Activity: Goal: Interest or engagement in activities will improve Outcome: Progressing   Problem: Coping: Goal: Ability to verbalize frustrations and anger appropriately will improve Outcome: Progressing   Problem: Safety: Goal: Periods of time without injury will increase Outcome: Progressing

## 2024-05-23 NOTE — Progress Notes (Signed)
 Pt reviewed all meds this morning and also refused VS.  Covers over head.  Denied SI and HI.  Denied A/V hallucinations.  Patient mumbles comments.

## 2024-05-23 NOTE — Group Note (Signed)
 Date:  05/23/2024 Time:  6:26 PM  Group Topic/Focus:  Physical Wellness:   This group focuses on introducing patients to Progressive Muscle Relaxation, a technique that involves tensing and then slowly releasing different muscle groups in the body. The goal is to help individuals become more aware of physical tension and stress in their body, and to learn how to consciously release it .   Participation Level:  Did Not Attend  Cruz JONETTA Mars 05/23/2024, 6:26 PM

## 2024-05-23 NOTE — Group Note (Signed)
 Date:  05/23/2024 Time:  6:20 PM  Group Topic/Focus:  Goals Group:   The focus of this group is to help patients establish daily goals to achieve during treatment and discuss how the patient can incorporate goal setting into their daily lives to aide in recovery. Orientation:   The focus of this group is to educate the patient on the purpose and policies of crisis stabilization and provide a format to answer questions about their admission.  The group details unit policies and expectations of patients while admitted.    Participation Level:  Did Not Attend   Brett Wu Mars 05/23/2024, 6:20 PM

## 2024-05-23 NOTE — Plan of Care (Signed)
 Nurse discussed anxiety, depression and coping skills with patient.

## 2024-05-23 NOTE — Group Note (Signed)
 Date:  05/23/2024 Time:  7:33 PM  Group Topic/Focus:  Healthy Relationships - Description of what a healthy relationship looks like and how each person needs to be active in the relationship.     Participation Level:  Did Not Attend   Juliene CHRISTELLA Huddle 05/23/2024, 7:33 PM

## 2024-05-23 NOTE — Group Note (Signed)
 Date:  05/23/2024 Time:  7:24 PM  Group Topic/Focus:   Emotional Education:   The focus of this group is to increase self-awareness, emotional expression, autonomy, and self esteem through discussion on control and a lyric substitution activity.   Participation Level:  Did Not Attend   Brett Wu 05/23/2024, 7:24 PM

## 2024-05-23 NOTE — Progress Notes (Signed)
 [facility] Inpatient Psychiatry Progress Note  Date: 05/23/2024 Patient: Brett Wu MRN: [id number]   ASSESSMENT:  Brett Wu is a 29 y.o. male  with a past psychiatric history of schizophrenia, methamphetamine use, and cannabis use disorder. Patient initially arrived to [facility] on 05/07/24 for increasing auditory hallucinations, delusions, and suicidal ideation and was admitted to [facility] under IVC on 05/07/24 for acute safety concerns and severe substance-induced psychosis or mood disturbances. Per chart review, patient has had a good response to Zyprexa  in the past, however had not been compliant with medications outpatient. Continued zyprexa  given the effectiveness despite poor adherence. He has been very isolated in room throughout hospital and was seen by ACTT on 1/9 who stated he was not at baseline, despite psychiatric team feeling he is nearing baseline and clearing psychiatrically.   05/23/2024 Continues to refuse to talk to this provider despite attempting to remove his covers. Similar response to RN. Will defer to primary provider Dr. Lenard when he returns as patient has better report with him.   PLAN:  # Primary psychotic disorder vs substance-induced psychosis -- Continue with Zyprexa  5 mg daily and 15 mg at bedtime, to limit oversedation in the day time   - Continue Prozac  to 40 mg daily daily for suspected depressed mood.   #Nicotine  withdrawal - Patient in need of nicotine  replacement; nicotine  patch 14 mg / 24 hours ordered. Smoking cessation encouraged  # PRN's - Trazodone  50 mg at bedtime as needed for insomnia - Atarax  25 mg TID as needed for anxiety - Agitation Protocol: haldol  + ativan  + benadryl       Risk Assessment: Patient continues to require inpatient hospitalization for safety and stabilization of acute psychosis.  Discharge Planning: Barriers to Discharge: safety planning in community, baseline psychiatric status Estimated Length  of Stay: To be determined Predicted Discharge Location: group home  INTERVAL HISTORY: Refuses to talk with provider today.    Physical Examination:  Vitals and nursing note reviewed MSK: pulls the covers when this provider tries to remove them.  HEENT: could not visualize today.   MENTAL STATUS EXAM:  Appearance: bed sheet over head  Behavior: wouldn't response, uncooperative  Attitude: refusing  Speech: no speech   Mood: would not asnwer  Affect: sheet over head  Thought Process: n/a  Thought Content: n/a  SI/HI: n/a  Perceptions: n/a  Judgement: Poor  Insight: poor  Fund of Knowledge: Limited   Lab Results:  CBC: WBC 12 CMP: unremarkable UDS: pos for amphetamines Ethanol: <10 TSH: WNL A1c: 5.7% Lipid panel: WNL HIV: negative RPR: Nonreactive   Vitals: Blood pressure 113/67, pulse 67, temperature 98.3 F (36.8 C), temperature source Oral, resp. rate 15, height 5' 10 (1.778 m), weight 62.6 kg, SpO2 100%.    Justino Cornish, MD PGY-2 Psychiatry Resident 05/23/2024, 2:20 PM

## 2024-05-23 NOTE — [facility] Group Notes (Signed)
 [facility] Group Notes:  (Nursing/MHT/Case Management/Adjunct)  Date:  05/23/2024  Time:  10:35 PM  Type of Therapy:  Psychoeducational Skills  Participation Level:  Minimal  Participation Quality:  Resistant  Affect:  Defensive and Flat  Cognitive:  Lacking  Insight:  Lacking  Engagement in Group:  Resistant  Modes of Intervention:  Education  Summary of Progress/Problems: The patient rated his day as a 4 out of a possible 10. He was initially hesitant to speak but eventually did so after being encouraged by his peers. He shared that every day is the same and that he has a lot of difficulty getting out of bed. His goal for tomorrow is to get out of bed in the morning.   Brett Wu S 05/23/2024, 10:35 PM

## 2024-05-23 NOTE — Progress Notes (Signed)
(  Sleep Hours) -8.75 (Any PRNs that were needed, meds refused, or side effects to meds)- Trazodone  (Any disturbances and when (visitation, over night)- N/A (Concerns raised by the patient)- none (SI/HI/AVH)- Denies

## 2024-05-24 NOTE — [facility] Group Notes (Signed)
 Adult Psychoeducational Group Note  Date:  05/24/2024 Time:  5:20 PM  Group Topic/Focus: Occupational Therapy  Participation Level:  Active  Participation Quality:  Appropriate  Affect:  Appropriate  Cognitive:  Appropriate  Insight: Appropriate  Engagement in Group:  Engaged  Modes of Intervention:  Discussion  Additional Comments:    Ronnell Puller 05/24/2024, 5:20 PM

## 2024-05-24 NOTE — Group Note (Signed)
 Recreation Therapy Group Note   Group Topic:Stress Management  Group Date: 05/24/2024 Start Time: 9056 End Time: 1058 Facilitators: Lariah Fleer-McCall, LRT,CTRS Location: 400 Hall Dayroom   Group Topic: Stress Management  Goal Area(s) Addresses:  Patient will identify positive stress management techniques. Patient will identify benefits of using stress management post d/c.  Behavioral Response:   Intervention: Let's Meditate App  Activity: LRT played a meditation called Turning the Page. The meditation encouraged patients to reflect on the past year, acknowledge their journey and everyday take each breath as a new step in towards their future goals.    Education:  Stress Management, Discharge Planning.   Education Outcome: Acknowledges Education   Affect/Mood: N/A   Participation Level: Did not attend    Clinical Observations/Individualized Feedback:     Plan: Continue to engage patient in RT group sessions 2-3x/week.   Brett Wu, LRT,CTRS  05/24/2024 1:08 PM

## 2024-05-24 NOTE — Plan of Care (Signed)
   Problem: Education: Goal: Emotional status will improve Outcome: Not Progressing Goal: Mental status will improve Outcome: Not Progressing

## 2024-05-24 NOTE — Progress Notes (Signed)
 Spiritual care group on grief and loss facilitated by Chaplain Rockie Sofia, [facility]  Group Goal: Support / Education around grief and loss  Members engage in facilitated group support and psycho-social education.  Group Description:  Following introductions and group rules, group members engaged in facilitated group dialogue and support around topic of loss, with particular support around experiences of loss in their lives. Group Identified types of loss (relationships / self / things) and identified patterns, circumstances, and changes that precipitate losses. Reflected on thoughts / feelings around loss, normalized grief responses, and recognized variety in grief experience. Group encouraged individual reflection on safe space and on the coping skills that they are already utilizing.  Group drew on Adlerian / Rogerian and narrative framework  Patient Progress: Brett Wu attended group and actively engaged and participated in group conversation and activities.

## 2024-05-24 NOTE — Progress Notes (Signed)
 [facility] Inpatient Psychiatry Progress Note  Date: 05/24/2024 Patient: Brett Wu MRN: [id number]   ASSESSMENT:  AMINE ADELSON is a 29 y.o. male  with a past psychiatric history of schizophrenia, methamphetamine use, and cannabis use disorder. Patient initially arrived to [facility] on 05/07/24 for increasing auditory hallucinations, delusions, and suicidal ideation and was admitted to [facility] under IVC on 05/07/24 for acute safety concerns and severe substance-induced psychosis or mood disturbances. Per chart review, patient has had a good response to Zyprexa  in the past, however had not been compliant with medications outpatient. Continued zyprexa  given the effectiveness despite poor adherence. He has been very isolated in room throughout hospital and was seen by ACTT on 1/9 who stated he was not at baseline, despite psychiatric team feeling he is nearing baseline and clearing psychiatrically.   05/24/2024 Patient more interactive with this provider and attending physician and medical students nearby during assessment.  Patient cooperative, and appropriate during interview.  Patient  denies any significant depression, however still remains somewhat guarded with responses and unable to identify motivation or goals for himself in order to be discharged.  Patient educated on becoming more involved in groups, staying out of room, engaging with providers goals to be considered.  Patient voiced understanding.  If patient continues to disengage with provider team, will look into doing a room lockout.  PLAN:  # Primary psychotic disorder vs substance-induced psychosis -- Continue with Zyprexa  5 mg daily and 15 mg at bedtime, to limit oversedation in the day time   - Continue Prozac  to 40 mg daily daily for suspected depressed mood.   #Nicotine  withdrawal - Patient in need of nicotine  replacement; nicotine  patch 14 mg / 24 hours ordered. Smoking cessation encouraged  # PRN's -  Discontinued trazodone  50 mg at bedtime as needed for insomnia, given patient's lack of motivation to get out of bed and reports of being tired - Atarax  25 mg TID as needed for anxiety - Agitation Protocol: haldol  + ativan  + benadryl     Risk Assessment: Patient continues to require inpatient hospitalization for safety and stabilization of acute psychosis.  Discharge Planning: Barriers to Discharge: safety planning in community, baseline psychiatric status Estimated Length of Stay: To be determined Predicted Discharge Location: group home  INTERVAL HISTORY: Patient more interactive with this provider on interview today alongside attending psychiatrist and medical students.  Patient denies any significant depression or anxiety.  He denies suicidal ideations, homicidal ideation or auditory visual hallucinations.  Patient suspects that his Zyprexa  is used to help keep him calm.  He is aware of his diagnosis of schizophrenia, however is unable to disclose specific things that the Zyprexa  is helpful with.  When asked patient about thoughts of discharge, patient reports I am not sure what will happen once I am discharged.  When asking further questions patient said that he did not want to talk about it.  Discussed that in order to have safe disposition, patient would need to disclose some thoughts.  Be cooperative during the interview.  Patient also unable to talk about his motivations in order to leave and goals.  Patient reports that he he is less motivated while being here, however is unsure of how he can improve to be discharged.   Physical Examination:  Vitals and nursing note reviewed MSK: pulls the covers when this provider tries to remove them.  HEENT: could not visualize today.   MENTAL STATUS EXAM:  Appearance: Patient sitting in room, seated up with  lights on  Behavior: Cooperative, somewhat guarded with certain responses  Attitude: Fine  Speech: Normal prosody and volume  Mood:  Okay  Affect: Not congruent, restricted  Thought Process: Coherent  Thought Content: Denies auditory visual hallucinations, patient does not appear to be responding to internal stimuli during my exam  SI/HI: Denies  Perceptions: Denies,  Judgement: Poor  Insight: Limited  Fund of Knowledge: Limited   Lab Results:  CBC: WBC 12 CMP: unremarkable UDS: pos for amphetamines Ethanol: <10 TSH: WNL A1c: 5.7% Lipid panel: WNL HIV: negative RPR: Nonreactive   Vitals: Blood pressure 115/73, pulse 64, temperature 98.3 F (36.8 C), temperature source Oral, resp. rate 15, height 5' 10 (1.778 m), weight 62.6 kg, SpO2 98%.   PATTI OLDEN, MD PGY-2 Psychiatry Resident 05/24/2024, 3:55 PM

## 2024-05-24 NOTE — B[facility] Group Notes (Signed)
 Adult Psychoeducational Group Note  Date:  05/24/2024 Time:  9:03 PM  Group Topic/Focus:  Wrap-Up Group:   The focus of this group is to help patients review their daily goal of treatment and discuss progress on daily workbooks.  Participation Level:  Minimal  Participation Quality:  Drowsy  Affect:  Flat  Cognitive:  Oriented  Insight: Limited  Engagement in Group:  Developing/Improving  Modes of Intervention:  Discussion  Additional Comments:  Patient attended the Wrap-up group.  [name] 05/24/2024, 9:03 PM

## 2024-05-24 NOTE — Plan of Care (Signed)
   Problem: Education: Goal: Knowledge of [place] [facility] Education information/materials will improve Outcome: Progressing Goal: Emotional status will improve Outcome: Progressing Goal: Mental status will improve Outcome: Progressing Goal: Verbalization of understanding the information provided will improve Outcome: Progressing

## 2024-05-24 NOTE — Progress Notes (Signed)
(  Sleep Hours) -7.75 (Any PRNs that were needed, meds refused, or side effects to meds)- Trazodone  (Any disturbances and when (visitation, over night)- n/a (Concerns raised by the patient)- none (SI/HI/AVH)- Denies

## 2024-05-24 NOTE — Group Note (Unsigned)
 Date:  05/24/2024 Time:  8:32 PM  Group Topic/Focus:  Wrap-Up Group:   The focus of this group is to help patients review their daily goal of treatment and discuss progress on daily workbooks.     Participation Level:  {BHH PARTICIPATION OZCZO:77735}  Participation Quality:  {BHH PARTICIPATION QUALITY:22265}  Affect:  {BHH AFFECT:22266}  Cognitive:  {BHH COGNITIVE:22267}  Insight: {BHH Insight2:20797}  Engagement in Group:  {BHH ENGAGEMENT IN HMNLE:77731}  Modes of Intervention:  {BHH MODES OF INTERVENTION:22269}  Additional Comments:  ***  Brett Wu [place] 05/24/2024, 8:32 PM

## 2024-05-24 NOTE — Progress Notes (Signed)
" °   05/24/24 1200  Psych Admission Type (Psych Patients Only)  Admission Status Involuntary  Psychosocial Assessment  Patient Complaints None  Eye Contact Fair  Facial Expression Flat  Affect Flat  Speech Soft  Interaction Evasive  Motor Activity Slow  Appearance/Hygiene Unremarkable  Behavior Characteristics Unwilling to participate  Mood Preoccupied  Thought Process  Coherency WDL  Content WDL  Delusions None reported or observed  Perception WDL  Hallucination None reported or observed  Judgment Impaired  Confusion None  Danger to Self  Current suicidal ideation? Denies  Danger to Others  Danger to Others None reported or observed    "

## 2024-05-24 NOTE — B[facility] Group Notes (Signed)
 Adult Psychoeducational Group Note  Date:  05/24/2024 Time:  3:41 PM  Group Topic/Focus: Physical Wellness  Participation Level:  Active  Participation Quality:  Appropriate and Attentive  Affect:  Appropriate  Cognitive:  Alert and Appropriate  Insight: Appropriate  Engagement in Group:  Engaged  Modes of Intervention:  Discussion  Additional Comments:    Ronnell Puller 05/24/2024, 3:41 PM

## 2024-05-25 MED ORDER — MELATONIN 5 MG PO TABS
5.0000 mg | ORAL_TABLET | Freq: Every day | ORAL | Status: DC
  Administered 2024-05-25 – 2024-05-28 (×4): 5 mg via ORAL
  Filled 2024-05-25 (×4): qty 1

## 2024-05-25 NOTE — BHH Group Notes (Signed)
 Adult Psychoeducational Group Note  Date:  05/25/2024 Time:  8:40 PM  Group Topic/Focus:  Wrap-Up Group:   The focus of this group is to help patients review their daily goal of treatment and discuss progress on daily workbooks.  Participation Level:  Minimal  Participation Quality:  Appropriate  Affect:  Appropriate  Cognitive:  Appropriate  Insight: Good  Engagement in Group:  Engaged  Modes of Intervention:  Discussion  Additional Comments:  Patient attended the Wrap-up group.  Brett Wu 05/25/2024, 8:40 PM

## 2024-05-25 NOTE — BHH Group Notes (Signed)
 Patient did not attend the Social Work group.

## 2024-05-25 NOTE — Plan of Care (Signed)
   Problem: Education: Goal: Emotional status will improve Outcome: Not Progressing Goal: Mental status will improve Outcome: Not Progressing

## 2024-05-25 NOTE — Group Note (Signed)
 Date:  05/25/2024 Time:  5:09 PM  Group Topic/Focus:  Dimensions of Wellness:   The focus of this group is to introduce the topic of wellness and discuss the role each dimension of wellness plays in total health.    Participation Level:  Active  Participation Quality:  Appropriate  Affect:  Appropriate  Cognitive:  Appropriate  Insight: Appropriate  Engagement in Group:  Engaged  Modes of Intervention:  Discussion   Brett Wu 05/25/2024, 5:09 PM

## 2024-05-25 NOTE — Group Note (Signed)
 LCSW Group Therapy Note   Group Date: 05/25/2024 Start Time: 1100 End Time: 1200   Participation:  did not attend  Type of Therapy:  Group Therapy   Topic:  Understanding Your Path to Change.  Objective:  The goal is to help individuals understand the stages of change, identify where they currently are in the process, and provide actionable next steps to continue moving forward in their journey of change.  Goals: - Learn about the six stages of change:  Precontemplation, Contemplation, Preparation, Action, Maintenance, and Relapse - Reflect on Current Change Efforts:  Recognize which stage participants are in regarding a personal change. - Plan Next Steps for Moving Forward:  Create an action plan based on their current stage of change.  Class Summary:  In this session, we explored the Stages of Change as a framework to understand the process of change.  We discussed how each stage helps individuals recognize where they are in their personal journey and used the Stages of Change Worksheet for self-reflection. Participants answered questions to better understand their current stage, challenges, and progress. We also emphasized the importance of moving forward, even if setbacks (Relapse) occur, and created actionable steps to help participants continue progressing. By the end of the session, participants gained a clearer understanding of their path to change and left with a clear plan for next steps.  Therapeutic Modalities: Elements of CBT (cognitive restructuring, problem solving)  Element of DBT (mindfulness, distress tolerance)   Zelpha Messing O Phuong Moffatt, LCSW 05/25/2024  1:10 PM

## 2024-05-25 NOTE — Progress Notes (Addendum)
 Brett Wu   Type of Note: [facility]  Called [phone] to reach [name] regarding coming back in this week to meet with patient. Was alerted from another staff/ACT member that Rosaline is not in the office today but should be back tomorrow. Will attempt to reach tomorrow to set up date/time.   Signed:  Nazanin Kinner, LCSW-A 05/25/2024  11:29 AM

## 2024-05-25 NOTE — [facility] Group Notes (Signed)
 Patient did not attend the [facility] group.

## 2024-05-25 NOTE — Group Note (Signed)
 Date:  05/25/2024 Time:  10:18 AM  Group Topic/Focus:  Goals Group:   The focus of this group is to help patients establish daily goals to achieve during treatment and discuss how the patient can incorporate goal setting into their daily lives to aide in recovery.    Participation Level:  Did Not Attend   Brett Wu 05/25/2024, 10:18 AM

## 2024-05-25 NOTE — Group Note (Signed)
 Recreation Therapy Group Note   Group Topic:Animal Assisted Therapy   Group Date: 05/25/2024 Start Time: 9050 End Time: 1030 Facilitators: Camilia Caywood-McCall, LRT,CTRS Location: [address] Dayroom   Animal-Assisted Activity (AAA) Program Checklist/Progress Notes Patient Eligibility Criteria Checklist & Daily Group note for Rec Tx Intervention  AAA/T Program Assumption of Risk Form signed by Patient/ or Parent Legal Guardian Yes  Patient understands his/her participation is voluntary Yes  Behavioral Response:    Education: Charity Fundraiser, Appropriate Animal Interaction   Education Outcome: Acknowledges education.    Affect/Mood: N/A   Participation Level: Did not attend    Clinical Observations/Individualized Feedback:      Plan: Continue to engage patient in RT group sessions 2-3x/week.   Zabian Swayne-McCall, LRT,CTRS 05/25/2024 1:01 PM

## 2024-05-25 NOTE — Progress Notes (Signed)
(  Sleep Hours) -6.25  (Any PRNs that were needed, meds refused, or side effects to meds)- none  (Any disturbances and when (visitation, over night)-none  (Concerns raised by the patient)- none  (SI/HI/AVH)- denies

## 2024-05-25 NOTE — Plan of Care (Signed)
  Problem: Education: Goal: Knowledge of [facility] Education information/materials will improve Outcome: Progressing Goal: Verbalization of understanding the information provided will improve Outcome: Progressing   Problem: Activity: Goal: Sleeping patterns will improve Outcome: Progressing   

## 2024-05-25 NOTE — Progress Notes (Signed)
 [facility] Inpatient Psychiatry Progress Note  Date: 05/25/2024 Patient: Brett Wu MRN: [id number]   ASSESSMENT:  Brett Wu is a 29 y.o. male  with a past psychiatric history of schizophrenia, methamphetamine use, and cannabis use disorder. Patient initially arrived to [facility] on 05/07/24 for increasing auditory hallucinations, delusions, and suicidal ideation and was admitted to [facility] under IVC on 05/07/24 for acute safety concerns and severe substance-induced psychosis or mood disturbances. Per chart review, patient has had a good response to Zyprexa  in the past, however had not been compliant with medications outpatient. Continued zyprexa  given the effectiveness despite poor adherence. He has been very isolated in room throughout hospital and was seen by ACTT on 1/9 who stated he was not at baseline, despite psychiatric team feeling he is nearing baseline and clearing psychiatrically.   05/25/2024 Patient was much more engaged and interactive with the provider yesterday and attended multiple groups for the first time during the afternoon sessions.  Upon my assessment this morning, patient less interactive with this provider and withdrawn to the bed. Patient appropriate but reports tiredness during interview.  Patient  denies any significant depression. Patient educated on becoming more involved in groups, staying out of room, engaging with providers goals to be considered. Discussed goals of attending groups in the morning and afternoon as a form of goals to works towards discharge. Patient wanted to continue further discussion later in the afternoon due to being tired.  PLAN:  # Primary psychotic disorder vs substance-induced psychosis -- Continue with Zyprexa  5 mg daily and 15 mg at bedtime, to limit oversedation in the day time   - Continue Prozac  to 40 mg daily daily for suspected depressed mood.   #Nicotine  withdrawal - Patient in need of nicotine  replacement;  nicotine  patch 14 mg / 24 hours ordered. Smoking cessation encouraged  # PRN's - Discontinued trazodone  50 mg at bedtime as needed for insomnia on 1/12, patient reports poor sleep last night, will consider re-initiating or considering alternatives  - Atarax  25 mg TID as needed for anxiety - Agitation Protocol: haldol  + ativan  + benadryl     Risk Assessment: Patient continues to require inpatient hospitalization for safety and stabilization of acute psychosis.  Discharge Planning: Barriers to Discharge: safety planning in community, baseline psychiatric status Estimated Length of Stay: To be determined Predicted Discharge Location: group home  INTERVAL HISTORY: Patient less interactive this morning with this provider.  Upon approach she had the covers over his head and did remove the covers after prompting by this clinical research associate.  Patient reports increased tiredness last night and poor sleep after discontinuing trazodone .  Patient denies any significant depression or anxiety symptoms.  Denies suicidal ideations, homicidal ideation auditory visual hallucinations.  When discussing goals towards discharge, patient initially says he does not know what goals to work towards.  Discussed the options of attending groups in the morning and in the evening, being thrown and engaged with the milieu.  Patient wanted to continue talk about goals later this afternoon and after he feels more weak.   Physical Examination:  Vitals and nursing note reviewed MSK: pulls the covers when this provider tries to remove them.  HEENT: could not visualize today.   MENTAL STATUS EXAM:  Appearance: Sitting in room, in the dark, with covers over her head  Behavior: Less cooperative cooperative, engaged with recurrent prompting  Attitude: Fine  Speech: Low prosody and volume  Mood: Right  Affect: Not congruent, restricted  Thought Process: Coherent  Thought Content: Denies auditory visual hallucinations, patient does not  appear to be responding to internal stimuli during my exam  SI/HI: Denies  Perceptions: Denies,  Judgement: Poor  Insight: Limited  Fund of Knowledge: Limited   Lab Results:  CBC: WBC 12 CMP: unremarkable UDS: pos for amphetamines Ethanol: <10 TSH: WNL A1c: 5.7% Lipid panel: WNL HIV: negative RPR: Nonreactive   Vitals: Blood pressure 115/73, pulse 64, temperature 98.3 F (36.8 C), temperature source Oral, resp. rate 15, height 5' 10 (1.778 m), weight 62.6 kg, SpO2 98%.   PATTI OLDEN, MD PGY-2 Psychiatry Resident 05/25/2024, 12:08 PM

## 2024-05-25 NOTE — Progress Notes (Signed)
(  Sleep Hours) -4.25 as of 0530 (Any PRNs that were needed, meds refused, or side effects to meds)- none (Any disturbances and when (visitation, over night)-none (Concerns raised by the patient)- none (SI/HI/AVH)- denies all

## 2024-05-26 ENCOUNTER — Encounter (HOSPITAL_COMMUNITY): Payer: Self-pay

## 2024-05-26 MED ORDER — POLYETHYLENE GLYCOL 3350 17 G PO PACK: 17.0000 g | PACK | Freq: Every day | ORAL | Status: DC

## 2024-05-26 MED ORDER — NICOTINE POLACRILEX 2 MG MT GUM
2.0000 mg | CHEWING_GUM | OROMUCOSAL | Status: DC | PRN
  Administered 2024-05-27 – 2024-05-28 (×2): 2 mg via ORAL
  Filled 2024-05-26 (×2): qty 1

## 2024-05-26 NOTE — Plan of Care (Signed)
  Problem: Education: Goal: Emotional status will improve Outcome: Progressing Goal: Verbalization of understanding the information provided will improve Outcome: Progressing   Problem: Activity: Goal: Interest or engagement in activities will improve Outcome: Progressing   

## 2024-05-26 NOTE — Plan of Care (Signed)
  Problem: Education: Goal: Emotional status will improve Outcome: Progressing Goal: Verbalization of understanding the information provided will improve Outcome: Progressing   Problem: Activity: Goal: Interest or engagement in activities will improve Outcome: Progressing Goal: Sleeping patterns will improve Outcome: Progressing

## 2024-05-26 NOTE — BH IP Treatment Plan (Signed)
 Interdisciplinary Treatment and Diagnostic Plan Update  05/26/2024 Time of Session: THIS IS AN UPDATE Brett Wu MRN: [id number]  Principal Diagnosis: Psychosis [facility])  Secondary Diagnoses: Principal Problem:   Psychosis (HCC)   Current Medications:  Current Facility-Administered Medications  Medication Dose Route Frequency Provider Last Rate Last Admin   acetaminophen  (TYLENOL ) tablet 650 mg  650 mg Oral Q6H PRN White, Patrice L, NP   650 mg at 05/22/24 1706   alum & mag hydroxide-simeth (MAALOX/MYLANTA) 200-200-20 MG/5ML suspension 30 mL  30 mL Oral Q4H PRN White, Patrice L, NP       haloperidol  (HALDOL ) tablet 5 mg  5 mg Oral TID PRN White, Patrice L, NP       And   diphenhydrAMINE  (BENADRYL ) capsule 50 mg  50 mg Oral TID PRN White, Patrice L, NP       haloperidol  lactate (HALDOL ) injection 5 mg  5 mg Intramuscular TID PRN White, Patrice L, NP       And   diphenhydrAMINE  (BENADRYL ) injection 50 mg  50 mg Intramuscular TID PRN White, Patrice L, NP       And   LORazepam  (ATIVAN ) injection 2 mg  2 mg Intramuscular TID PRN White, Patrice L, NP       haloperidol  lactate (HALDOL ) injection 10 mg  10 mg Intramuscular TID PRN White, Patrice L, NP       And   diphenhydrAMINE  (BENADRYL ) injection 50 mg  50 mg Intramuscular TID PRN White, Patrice L, NP       And   LORazepam  (ATIVAN ) injection 2 mg  2 mg Intramuscular TID PRN White, Patrice L, NP       FLUoxetine  (PROZAC ) capsule 40 mg  40 mg Oral Daily [name], MD   40 mg at 05/26/24 9074   hydrOXYzine  (ATARAX ) tablet 25 mg  25 mg Oral TID PRN White, Patrice L, NP   25 mg at 05/25/24 2119   magnesium  hydroxide (MILK OF MAGNESIA) suspension 30 mL  30 mL Oral Daily PRN White, Patrice L, NP       melatonin tablet 5 mg  5 mg Oral QHS [name], MD   5 mg at 05/25/24 2119   neomycin -bacitracin -polymyxin (NEOSPORIN) ointment   Topical PRN [name], DO   Given at 05/22/24 1706   nicotine  (NICODERM CQ  - dosed in mg/24  hours) patch 14 mg  14 mg Transdermal Daily Ajibola, Ene A, NP   14 mg at 05/25/24 1110   nicotine  polacrilex (NICORETTE ) gum 2 mg  2 mg Oral PRN Bennett, Christal H, NP       OLANZapine  zydis (ZYPREXA ) disintegrating tablet 15 mg  15 mg Oral QHS [name], MD   15 mg at 05/25/24 2119   OLANZapine  zydis (ZYPREXA ) disintegrating tablet 5 mg  5 mg Oral Daily Rainey, Donovan, MD   5 mg at 05/26/24 0925   polyethylene glycol (MIRALAX  / GLYCOLAX ) packet 17 g  17 g Oral Daily [name], MD       PTA Medications: No medications prior to admission.    Patient Stressors:    Patient Strengths:    Treatment Modalities: Medication Management, Group therapy, Case management,  1 to 1 session with clinician, Psychoeducation, Recreational therapy.   Physician Treatment Plan for Primary Diagnosis: Psychosis (HCC) Long Term Goal(s):     Short Term Goals: Ability to identify changes in lifestyle to reduce recurrence of condition will improve Ability to identify triggers associated with substance abuse/mental health  issues will improve  Medication Management: Evaluate patient's response, side effects, and tolerance of medication regimen.  Therapeutic Interventions: 1 to 1 sessions, Unit Group sessions and Medication administration.  Evaluation of Outcomes: Progressing  Physician Treatment Plan for Secondary Diagnosis: Principal Problem:   Psychosis (HCC)  Long Term Goal(s):     Short Term Goals: Ability to identify changes in lifestyle to reduce recurrence of condition will improve Ability to identify triggers associated with substance abuse/mental health issues will improve     Medication Management: Evaluate patient's response, side effects, and tolerance of medication regimen.  Therapeutic Interventions: 1 to 1 sessions, Unit Group sessions and Medication administration.  Evaluation of Outcomes: Progressing   RN Treatment Plan for Primary Diagnosis: Psychosis (HCC) Long Term  Goal(s): Knowledge of disease and therapeutic regimen to maintain health will improve  Short Term Goals: Ability to remain free from injury will improve, Ability to verbalize frustration and anger appropriately will improve, Ability to demonstrate self-control, Ability to participate in decision making will improve, Ability to verbalize feelings will improve, Ability to disclose and discuss suicidal ideas, Ability to identify and develop effective coping behaviors will improve, and Compliance with prescribed medications will improve  Medication Management: RN will administer medications as ordered by provider, will assess and evaluate patient's response and provide education to patient for prescribed medication. RN will report any adverse and/or side effects to prescribing provider.  Therapeutic Interventions: 1 on 1 counseling sessions, Psychoeducation, Medication administration, Evaluate responses to treatment, Monitor vital signs and CBGs as ordered, Perform/monitor CIWA, COWS, AIMS and Fall Risk screenings as ordered, Perform wound care treatments as ordered.  Evaluation of Outcomes: Progressing   LCSW Treatment Plan for Primary Diagnosis: Psychosis (HCC) Long Term Goal(s): Safe transition to appropriate next level of care at discharge, Engage patient in therapeutic group addressing interpersonal concerns.  Short Term Goals: Engage patient in aftercare planning with referrals and resources, Increase social support, Increase ability to appropriately verbalize feelings, Increase emotional regulation, Facilitate acceptance of mental health diagnosis and concerns, Facilitate patient progression through stages of change regarding substance use diagnoses and concerns, Identify triggers associated with mental health/substance abuse issues, and Increase skills for wellness and recovery  Therapeutic Interventions: Assess for all discharge needs, 1 to 1 time with Social worker, Explore available resources  and support systems, Assess for adequacy in community support network, Educate family and significant other(s) on suicide prevention, Complete Psychosocial Assessment, Interpersonal group therapy.  Evaluation of Outcomes: Progressing   Progress in Treatment: Attending groups: attended some groups Participating in groups: Yes Taking medication as prescribed: no medications have been scheduled for patient at this time. Toleration medication: N/A Family/Significant other contact made: Yes, contacted:  [facility] KARLYN Elspeth [place], [phone] Patient understands diagnosis: No. Discussing patient identified problems/goals with staff: No. Medical problems stabilized or resolved: Yes. Denies suicidal/homicidal ideation: Yes. Issues/concerns per patient self-inventory: No.   New problem(s) identified:  No   New Short Term/Long Term Goal(s):    medication stabilization, elimination of SI thoughts, development of comprehensive mental wellness plan.    Patient Goals:  patient declined to participate in the treatment team meeting.  Patient had his head covered up and did not respond.   Discharge Plan or Barriers:  Patient recently admitted. CSW will continue to follow and assess for appropriate referrals and possible discharge planning.    Reason for Continuation of Hospitalization: Hallucinations Medication stabilization Suicidal ideation   Estimated Length of Stay:  3 - 4 days  Last 3  [place] Suicide Severity Risk Score: Flowsheet Row ED to Hosp-Admission (Current) [facility] Most recent reading at 05/07/2024  7:36 PM ED from 05/07/2024 in [facility] Emergency Department at [facility] Most recent reading at 05/07/2024  7:37 AM ED from 10/22/2021 in [facility] Emergency Department at [facility] Most recent reading at 10/22/2021  6:07 PM  C-SSRS RISK CATEGORY No Risk No Risk No Risk    Last PHQ  2/9 Scores:     No data to display          Scribe for Treatment Team: Aarohi Redditt  Nunez-Uva, LCSWA 05/26/2024 2:25 PM

## 2024-05-26 NOTE — Group Note (Signed)
 Date:  05/26/2024 Time:  9:50 AM  Group Topic/Focus:  Goals Group:   The focus of this group is to help patients establish daily goals to achieve during treatment and discuss how the patient can incorporate goal setting into their daily lives to aide in recovery. Orientation:   The focus of this group is to educate the patient on the purpose and policies of crisis stabilization and provide a format to answer questions about their admission.  The group details unit policies and expectations of patients while admitted.    Participation Level:  Did Not Attend   Astraea Gaughran 05/26/2024, 9:50 AM

## 2024-05-26 NOTE — Group Note (Signed)
 Date:  05/26/2024 Time:  1:31 PM  Group Topic/Focus:  Spirituality:   The focus of this group is to discuss how one's spirituality can aide in recovery.    Participation Level:  Did Not Attend   Kele Barthelemy 05/26/2024, 1:31 PM

## 2024-05-26 NOTE — Progress Notes (Signed)
(  Sleep Hours) -9.75  (Any PRNs that were needed, meds refused, or side effects to meds)- hydroxyzine   (Any disturbances and when (visitation, over night)-no  (Concerns raised by the patient)- no  (SI/HI/AVH)- denies

## 2024-05-26 NOTE — Progress Notes (Signed)
 Spirituality group facilitated by Elia Rockie Sofia, BCC.  Group Description: Group focused on topic of community. Patients participated in facilitated discussion around topic, connecting with one another around experiences and definitions for community. Group members engaged with visual explorer photos, reflecting on what community looks like for them today. Group engaged in discussion around how their definitions of community are present today in hospital.  Modalities: Psycho-social ed, Adlerian, Narrative, MI  Patient Progress: Brett Wu attended group.  Though verbal participation was minimal, he demonstrated engagement in the group conversation and activities.

## 2024-05-26 NOTE — Progress Notes (Signed)
 Dorise DELENA Bars   Type of Note: [facility]  Called [phone] and spoke with [name] this morning from pt's ACT team to schedule another visit to assess pt's baseline. Rosaline reported she would call this writer back today with a time that worked for her.   No call back received at this time. Will attempt to reach [facility] tomorrow to solidify a time to meet on Friday. Rosaline did note that if pt is discharged Friday, she is unable to provide transportation for pt back home. Pt will likely need taxi voucher at discharge back to address on file.   Signed:  Aleja Yearwood, LCSW-A 05/26/2024  4:13 PM

## 2024-05-26 NOTE — Progress Notes (Signed)
" °   05/26/24 0900  Psych Admission Type (Psych Patients Only)  Admission Status Involuntary  Psychosocial Assessment  Patient Complaints Isolation  Eye Contact Brief  Facial Expression Flat  Affect Flat  Speech Soft  Interaction Minimal  Motor Activity Slow  Appearance/Hygiene Unremarkable  Behavior Characteristics Cooperative  Mood Preoccupied;Pleasant  Thought Process  Coherency WDL  Content WDL  Delusions None reported or observed  Perception WDL  Hallucination None reported or observed  Judgment Poor  Confusion None  Danger to Self  Current suicidal ideation? Denies  Danger to Others  Danger to Others None reported or observed    "

## 2024-05-26 NOTE — Group Note (Signed)
 Date:  05/26/2024 Time:  5:18 PM  Group Topic/Focus:  Managing Feelings:   The focus of this group is to identify what feelings patients have difficulty handling and develop a plan to handle them in a healthier way upon discharge. Overcoming Stress:   The focus of this group is to define stress and help patients assess their triggers.    Participation Level:  Active  Participation Quality:  Appropriate  Affect:  Appropriate  Cognitive:  Appropriate  Insight: Appropriate  Engagement in Group:  Engaged  Modes of Intervention:  Activity  Additional Comments:    Asberry CROME Arnesia Vincelette 05/26/2024, 5:18 PM

## 2024-05-26 NOTE — Group Note (Signed)
 Date:  05/26/2024 Time:  10:15 AM  Group Topic/Focus:  Recreational Therapy    Participation Level:  Attended   Brett Wu 05/26/2024, 10:15 AM

## 2024-05-26 NOTE — [facility] Group Notes (Signed)
 Adult Psychoeducational Group Note  Date:  05/26/2024 Time:  9:41 PM  Group Topic/Focus:  Wrap-Up Group:   The focus of this group is to help patients review their daily goal of treatment and discuss progress on daily workbooks.  Participation Level:  Active  Participation Quality:  Appropriate  Affect:  Appropriate  Cognitive:  Appropriate  Insight: Appropriate  Engagement in Group:  Engaged  Modes of Intervention:  Discussion  Additional Comments:  Carlson his day 7. Goal for today get up in the morning. Coping skills drinking coffee and going to group. Favorite part of the day night.   Lang Drilling Long 05/26/2024, 9:41 PM

## 2024-05-26 NOTE — Progress Notes (Addendum)
 [facility] Inpatient Psychiatry Progress Note  Date: 05/26/2024 Patient: Brett Wu MRN: [id number]   ASSESSMENT:  Brett Wu is a 29 y.o. male with a past psychiatric history of schizophrenia, methamphetamine use, and cannabis use disorder. Patient initially arrived to [facility] on 05/07/24 for increasing auditory hallucinations, delusions, and suicidal ideation and was admitted to [facility] under IVC on 05/07/24 for acute safety concerns and likely substance-induced exacerbation of primary psychotic disorder. He is showing clinical stabilization of mood, improved sleep, increased engagement in groups, and resolution of psychotic symptoms.  Room lockout was initiated yesterday and the patient was appropriate on the milieu, interacting calmly and cooperatively with other patients, cooperative with the staff and he was not observed to be responding to internal stimuli throughout the evening.  He currently denies SI, anxiety, and perceptual disturbances. Affect appears more reactive, with moments of laughter , smiling and was appropriate on exam. Will start MiraLAX  for constipation. Current medication regimen will be continued without changes.  Also obtained information from the patient's sister who noted improvement and more expressive with his thoughts in their discussions. She plans to come and visit the patient this afternoon. Will discuss with social work coordinating a visit from the patient's ACT team on Friday to support care continuity and discharge planning. Will consider discharge possibly Friday or Saturday if patient continues to improve.  05/26/2024  PLAN:  # Substance-Induced Exacerbation of Primary Psychotic Disorder -- Continue with Zyprexa  5 mg daily and 15 mg at bedtime, to limit oversedation in the day time   - Continue Prozac  to 40 mg daily daily for suspected depressed mood. -- Continue with room lockout during the day, to observe pt's behavior on the milieu     #Nicotine  withdrawal - Patient in need of nicotine  replacement; nicotine  patch 14 mg / 24 hours ordered. Smoking cessation encouraged  #Constipation  Started Miralax  every day   #Insomnia  Continue with Melatonin 5 mg at bedtime   # PRN's - Discontinued trazodone  50 mg at bedtime as needed for insomnia on 1/12, patient reports poor sleep last night, will consider re-initiating or considering alternatives  - Atarax  25 mg TID as needed for anxiety - Agitation Protocol: haldol  + ativan  + benadryl     Risk Assessment: Patient continues to require inpatient hospitalization for safety and stabilization of acute psychosis.  Discharge Planning: Barriers to Discharge: safety planning in community, baseline psychiatric status Estimated Length of Stay: Friday or Saturday ( 1/16 or 1/17) Predicted Discharge Location: group home  INTERVAL HISTORY: No acute events overnight period PRN hydroxyzine  was administered for sleep with good effect.   Today, the patient reports his mood is good and notes improve sleep compared to prior days. He states his depression is okay today and denies symptoms of anxiety. He denies suicide ideation as well as auditory or visual hallucinations. The patient reports constipation, noting no bowel movements for approximately 2 days. The initiation of MiraLAX  was discussed and the patient is agreeable to starting this for bowel regimen support. Per patient report and chart review, the patient has been more engaged and interactive during group activities, which appears to coincide with recent structured room lockout periods implemented to encourage participation in the milieu.On interview today, the patient was observed smiling and demonstrated a broader range of effect. He provided consent for the treatment team to contact his sister, [name], to obtain collateral information.  Collateral History Collateral history collected from the patient's sister, [name], and her husband.  They  report that at baseline the patient is calm, chill, and naturally introverted. They note clear improvement recently, stating they can hear it in his voice and that he seems significantly better overall. They shared that the patient's mother passed away around mid-2025, which they believe has had a substantial impact on him. Since her death, he has been more emotionally withdrawn, less communicative, and more depressed. They note he struggles to express his emotions and at times feels depressed without knowing how to cope. However, they report that he is now beginning to express himself more. They also report that the patient expressed interest in seeing his sister and asked about visitation period hospital address and visitation hours were provided.  Physical Examination:  Vitals and nursing note reviewed MSK: Normal range of motion.   MENTAL STATUS EXAM:  Appearance: Dressed appropriately  Behavior: Calm, cooperative, good eye contact  Attitude: Fine  Speech: Normal rate, rhythm, and volume  Mood: Good  Affect: Euthymic, full range, congruent with mood  Thought Process: linear, logical, goal-directed  Thought Content: Denies delusions, hallucinations, suicide ideation, or homicidal ideation.   SI/HI: Denies  Perceptions: Denies  Judgement: Fair  Insight: Fair  Fund of Knowledge: WNL   Lab Results:  CBC: WBC 12 CMP: unremarkable UDS: pos for amphetamines Ethanol: <10 TSH: WNL A1c: 5.7% Lipid panel: WNL HIV: negative RPR: Nonreactive   Vitals: Blood pressure 104/69, pulse 68, temperature 98.3 F (36.8 C), temperature source Oral, resp. rate 15, height 5' 10 (1.778 m), weight 62.6 kg, SpO2 98%.   Dwane GORMAN Glatter, Medical Student [employer] 3rd year medical student  05/26/2024, 11:02 AM  I personally was present and performed or re-performed the history, physical exam and medical decision-making activities of this service and have verified that the service and findings are  accurately documented in the students note.  PATTI OLDEN, MD PGY-2 Psychiatry Resident 05/26/2024, 1:23 PM      Total Time Spent in Direct Patient Care:  I personally spent 30 minutes on the unit in direct patient care. The direct patient care time included face-to-face time with the patient, reviewing the patient's chart, communicating with other professionals, and coordinating care.   I have independently evaluated the patient during a face-to-face assessment. I reviewed the patient's chart, and I participated in key portions of the service. I discussed the case with the Resident, and I agree with the assessment and plan of care as documented in the Resident's note, as addended by me or notated below:   Much more engaging today with the team.  We found him participating in the group therapy.  This is the first time of seeing the patient out of his room since admission.  Appear to have good affective interchange between patient and the resident.  The patient smiled and laughed couple times and addressed each individual appropriately in the group.  No apparent hallucinations.  No delusional content.  He is calm and friendly today.  Attended groups yesterday as well.  He appears to be progressing and stabilizing.  If he continues to have this course of improvement, we will plan to discharge Friday or Saturday.  Lamar Slain DO Psychiatrist

## 2024-05-26 NOTE — Group Note (Deleted)
 Date:  05/26/2024 Time:  9:24 AM  Group Topic/Focus:  Goals Group:   The focus of this group is to help patients establish daily goals to achieve during treatment and discuss how the patient can incorporate goal setting into their daily lives to aide in recovery. Orientation:   The focus of this group is to educate the patient on the purpose and policies of crisis stabilization and provide a format to answer questions about their admission.  The group details unit policies and expectations of patients while admitted.     Participation Level:  {BHH PARTICIPATION OZCZO:77735}  Participation Quality:  {BHH PARTICIPATION QUALITY:22265}  Affect:  {BHH AFFECT:22266}  Cognitive:  {BHH COGNITIVE:22267}  Insight: {BHH Insight2:20797}  Engagement in Group:  {BHH ENGAGEMENT IN HMNLE:77731}  Modes of Intervention:  {BHH MODES OF INTERVENTION:22269}  Additional Comments:  ***  Mazal Ebey 05/26/2024, 9:24 AM

## 2024-05-26 NOTE — Group Note (Signed)
 Recreation Therapy Group Note   Group Topic:Communication  Group Date: 05/26/2024 Start Time: 0940 End Time: 1005 Facilitators: Alize Borrayo-McCall, LRT,CTRS Location: [address] Dayroom   Group Topic: Communication, Team Building, Problem Solving  Goal Area(s) Addresses:  Patient will effectively work with peer towards shared goal.  Patient will identify skills used to make activity successful.  Patient will identify how skills used during activity can be applied to reach post d/c goals.   Behavioral Response: Engaged  Intervention: STEM Activity- Glass Blower/designer  Activity: Tallest [employer]. In teams of 5-6, patients were given 11 craft pipe cleaners. Using the materials provided, patients were instructed to compete again the opposing team(s) to build the tallest free-standing structure from floor level. The activity was timed; difficulty increased by clinical research associate as production designer, theatre/television/film continued.  Systematically resources were removed with additional directions for example, placing one arm behind their back, working in silence, and shape stipulations. LRT facilitated post-activity discussion reviewing team processes and necessary communication skills involved in completion. Patients were encouraged to reflect how the skills utilized, or not utilized, in this activity can be incorporated to positively impact support systems post discharge.  Education: Pharmacist, Community, Scientist, Physiological, Discharge Planning   Education Outcome: Acknowledges education/In group clarification offered/Needs additional education.    Affect/Mood: Appropriate   Participation Level: Engaged   Participation Quality: Independent   Behavior: Appropriate   Speech/Thought Process: Focused   Insight: Good   Judgement: Good   Modes of Intervention: STEM Activity   Patient Response to Interventions:  Engaged   Education Outcome:  In group clarification offered    Clinical  Observations/Individualized Feedback: Pt attended and participated in group session.      Plan: Continue to engage patient in RT group sessions 2-3x/week.   Aleria Maheu-McCall, LRT,CTRS 05/26/2024 1:25 PM

## 2024-05-27 NOTE — Progress Notes (Signed)
 [facility] Inpatient Psychiatry Progress Note  Date: 05/27/2024 Patient: Brett Wu MRN: [id number]  ASSESSMENT:  Brett Wu is a 29 y.o. male with a past psychiatric history of schizophrenia, methamphetamine use, and cannabis use disorder. Patient initially arrived to [facility] on 05/07/24 for increasing auditory hallucinations, delusions, and suicidal ideation and was admitted to [facility] under IVC on 05/07/24 for acute safety concerns and likely substance-induced exacerbation of primary psychotic disorder.  05/27/24 Patient continues to show some improvement with engagement in afternoon with attending of groups.  Patient did require some prompting to get out of bed this morning, however was able to get out of bed and was appropriate on the floor throughout the remainder of the day.  Patient continues to display some mood reactivity with this provider, sharing moments of laughter and smiling when speaking about certain topics.  He denies any significant depression, anxiety, suicidal ideation or auditory visual hallucinations.  Patient did disclose that he is hopeful for discharge and is aware of his ACT team coming to visit him tomorrow afternoon.  Patient reports that he currently has his own home and his uncle has the key to his house.  Once medically stable for discharge, patient reports he would like to get picked up by his sister who will be able to take him home.  Continue medications as ordered.  PLAN:  # Substance-Induced Exacerbation of Primary Psychotic Disorder -- Continue with Zyprexa  5 mg daily and 15 mg at bedtime, to limit oversedation in the day time   - Continue Prozac  to 40 mg daily daily for suspected depressed mood. -- Continue with room lockout during the day, to observe pt's behavior on the milieu    #Nicotine  withdrawal - Patient in need of nicotine  replacement; nicotine  patch 14 mg / 24 hours ordered. Smoking cessation encouraged  #Constipation   Continue MiraLAX  Miralax  every day   #Insomnia  Continue with Melatonin 5 mg at bedtime   # PRN's - Atarax  25 mg TID as needed for anxiety - Agitation Protocol: haldol  + ativan  + benadryl     Risk Assessment: Patient continues to require inpatient hospitalization for safety and stabilization of acute psychosis.  Discharge Planning: Barriers to Discharge: safety planning in community, baseline psychiatric status, evaluation  Estimated Length of Stay: Saturday 1/17 Predicted Discharge Location: Personal home  INTERVAL HISTORY: No acute events overnight.   Upon assessment this morning, patient was laying in the bed with his face covered.  Patient eventually engaged with the provider team and stated his mood was okay.  Patient continues to report feelings of tiredness and some limited motivation to get up.  When discussing possible discharge, continued to reiterate the goals of discharge being his participation and attendance of groups.  Patient also reports that he has continued to speak with his sister.  Discussed with the patient that his ACT team would be coming to assess him tomorrow, he reports that he is aware and when asked disclose that he feels like he has been getting better while here.  Patient is concerned about returning back home, however or disclose thoughts.  Patient does not appear to be responding to internal stimuli on my exam and was appropriate throughout the interaction.  Collateral History 05/26/24 Collateral history collected from the patient's sister, [name], and her husband. They report that at baseline the patient is calm, chill, and naturally introverted. They note clear improvement recently, stating they can hear it in his voice and that he seems significantly better  overall. They shared that the patient's mother passed away around mid-2025, which they believe has had a substantial impact on him. Since her death, he has been more emotionally withdrawn, less  communicative, and more depressed. They note he struggles to express his emotions and at times feels depressed without knowing how to cope. However, they report that he is now beginning to express himself more. They also report that the patient expressed interest in seeing his sister and asked about visitation period hospital address and visitation hours were provided.  Physical Examination:  Vitals and nursing note reviewed MSK: Normal range of motion.   MENTAL STATUS EXAM:  Appearance: Dressed appropriately  Behavior: Calm, Sleepy   Attitude: Fine  Speech: Normal rate, rhythm, and volume  Mood: Alright  Affect: Constricted, moments of appropriate laughter  Thought Process: linear, logical, goal-directed  Thought Content: Denies delusions, hallucinations, suicide ideation, or homicidal ideation.   SI/HI: Denies  Perceptions: Denies  Judgement: Fair  Insight: Fair  Fund of Knowledge: WNL   Lab Results:  CBC: WBC 12 CMP: unremarkable UDS: pos for amphetamines Ethanol: <10 TSH: WNL A1c: 5.7% Lipid panel: WNL HIV: negative RPR: Nonreactive   Vitals: Blood pressure 112/71, pulse 74, temperature 98.3 F (36.8 C), temperature source Oral, resp. rate 15, height 5' 10 (1.778 m), weight 62.6 kg, SpO2 98%.   PATTI OLDEN, MD PGY-2 Psychiatry Resident 05/27/2024, 4:23 PM

## 2024-05-27 NOTE — Progress Notes (Addendum)
 Dorise DELENA Bars   Type of Note: [facility] ACTT  Received call from [name], she is able to come see patient tomorrow, Friday at 1:00 PM to assess baseline and check in with pt.   255 PM: conference room booked.  Signed:  Cregg Jutte, LCSW-A 05/27/2024  11:48 AM

## 2024-05-27 NOTE — Group Note (Signed)
 Date:  05/27/2024 Time:  10:05 AM  Group Topic/Focus: Nutrition Group  For adult patients, mental health is strongly supported by adequate intake of key nutrient groups that maintain brain function and emotional regulation. Complex carbohydrates provide a steady source of energy and support serotonin production, while proteins supply essential amino acids needed for neurotransmitters such as dopamine and serotonin. Healthy fats, particularly omega-3 fatty acids, are vital for brain cell membrane integrity and cognitive function. Micronutrients including B-complex vitamins, vitamin D, magnesium , zinc , and iron play critical roles in mood regulation, stress response, and neural signaling, with deficiencies often linked to depression and anxiety. Antioxidants from fruits and vegetables help protect the brain from oxidative stress, and dietary fiber along with probiotics supports the gut-brain axis, which influences mood and behavior. Adequate hydration further supports concentration, energy levels, and emotional stability.    Participation Level:  Did Not Attend   [name] Fredericks 05/27/2024, 10:05 AM

## 2024-05-27 NOTE — Care Management Important Message (Signed)
 Brett Wu   Type of Note: Medicare IM  Attempted to have pt sign IM paperwork as he refused prior to admission. Pt refused again this morning. I am not signing that. Leave me alone. Pt was given a copy of [employer] information.   Put in MR bin with second refusal notated to be scanned.   Signed:  Masaichi Kracht, LCSW-A 05/27/2024  10:43 AM

## 2024-05-27 NOTE — Plan of Care (Signed)
   Problem: Education: Goal: Knowledge of [facility] Education information/materials will improve Outcome: Progressing Goal: Emotional status will improve Outcome: Progressing Goal: Mental status will improve Outcome: Progressing   Problem: Activity: Goal: Interest or engagement in activities will improve Outcome: Progressing

## 2024-05-27 NOTE — Group Note (Signed)
 Date:  05/27/2024 Time:  2:44 PM  Group Topic/Focus: Social work Anger is a normal and natural emotion, but when it is not understood or managed well, it can negatively affect our mental health, relationships, and daily functioning. In this group, we explore how anger often serves as a signal that something feels unfair, threatening, or overwhelming. By learning to recognize early warning signs--such as physical tension, racing thoughts, or irritability--patients can begin to pause before reacting. Anger management skills like deep breathing, grounding techniques, healthy communication, and problem-solving help individuals express their feelings in safer and more constructive ways. Developing these skills empowers patients to gain better emotional control, reduce conflict, and improve overall well-being.    Participation Level:  Did Not Attend  Brett Wu 05/27/2024, 2:44 PM

## 2024-05-27 NOTE — Group Note (Signed)
 LCSW Group Therapy Note   Group Date: 05/27/2024 Start Time: 1100 End Time: 1200   Participation:  did not attend  Type of Therapy:  Group Therapy  Topic:  Healing Flames: Navigating Anger with Compassion  Objective:  Foster self-awareness and promote compassion toward oneself and others when dealing with anger.  Goals:  Help participants understand the underlying emotions and needs fueling anger. Provide coping strategies for healthier emotional expression and anger management.  Summary: This session explored anger as a volcano--an explosion driven by deeper feelings and unmet needs. Participants learned to identify anger triggers and underlying emotions, then practiced coping strategies like deep breathing, physical activity, and journaling. The group discussed healthy ways to manage anger before it escalates, using both personal reflection and shared experiences.  Therapeutic Modalities: Cognitive Behavioral Therapy (CBT): Challenging thoughts that fuel anger. Mindfulness: Increasing awareness of emotions and sensations.   Kelcy Baeten O Shamari Trostel, LCSWA 05/27/2024  2:44 PM

## 2024-05-27 NOTE — BHH Group Notes (Signed)
 Adult Psychoeducational Group Note  Date:  05/27/2024 Time:  8:43 PM  Group Topic/Focus:  Wrap-Up Group:   The focus of this group is to help patients review their daily goal of treatment and discuss progress on daily workbooks.  Participation Level:  Active  Participation Quality:  Attentive  Affect:  Appropriate  Cognitive:  Alert  Insight: Appropriate  Engagement in Group:  Engaged  Modes of Intervention:  Discussion  Additional Comments:  Patient attended and participated in the Wrap-up group.  Brett Wu 05/27/2024, 8:43 PM

## 2024-05-27 NOTE — Care Management Important Message (Signed)
 Medicare IM printed and given to social work to give to the patient. ?

## 2024-05-27 NOTE — Group Note (Signed)
 Date:  05/27/2024 Time:  4:45 PM  Group Topic/Focus: occupational therapy Grounding techniques in occupational therapy help adult mental health patients manage stress, anxiety, and emotional distress by bringing attention to the present moment. These techniques use sensory input, physical movement, breathing, or cognitive focus to support emotional regulation and improve participation in daily activities. By integrating grounding strategies into meaningful occupations and routines, occupational therapists promote coping skills, independence, and overall mental well-being.     Participation Level:  Did Not Attend   Brett Wu 05/27/2024, 4:45 PM

## 2024-05-27 NOTE — Progress Notes (Signed)
" °   05/27/24 1700  Psych Admission Type (Psych Patients Only)  Admission Status Involuntary  Psychosocial Assessment  Patient Complaints Isolation  Eye Contact Brief  Facial Expression Flat  Affect Flat  Speech Soft  Interaction Assertive  Motor Activity Slow  Appearance/Hygiene Unremarkable  Behavior Characteristics Cooperative  Mood Pleasant  Thought Process  Coherency WDL  Content WDL  Delusions None reported or observed  Perception WDL  Hallucination None reported or observed  Judgment Poor  Confusion None  Danger to Self  Current suicidal ideation? Denies  Danger to Others  Danger to Others None reported or observed    "

## 2024-05-27 NOTE — Group Note (Signed)
 Date:  05/27/2024 Time:  9:55 AM  Group Topic/Focus: Goals and orientation Goals Group:   The focus of this group is to help patients establish daily goals to achieve during treatment and discuss how the patient can incorporate goal setting into their daily lives to aide in recovery. Orientation:   The focus of this group is to educate the patient on the purpose and policies of crisis stabilization and provide a format to answer questions about their admission.  The group details unit policies and expectations of patients while admitted.    Participation Level:  Did Not Attend   Brett Wu 05/27/2024, 9:55 AM

## 2024-05-27 NOTE — Group Note (Signed)
 Date:  05/27/2024 Time:  3:04 PM  Group Topic/Focus:Emotional Wellness Emotional wellness involves understanding, expressing, and managing our emotions in healthy ways. It means being aware of how feelings like stress, anger, sadness, or happiness affect our thoughts and behaviors. Practicing emotional wellness helps individuals cope with challenges, build resilience, and maintain positive relationships. By using healthy coping skills--such as self-reflection, relaxation techniques, and seeking support--patients can improve emotional balance, enhance self-esteem, and support their overall mental health.    Participation Level:  Did Not Attend   [name] Fredericks 05/27/2024, 3:04 PM

## 2024-05-28 DIAGNOSIS — F15959 Other stimulant use, unspecified with stimulant-induced psychotic disorder, unspecified: Secondary | ICD-10-CM

## 2024-05-28 MED ORDER — POLYETHYLENE GLYCOL 3350 17 G PO PACK: 17.0000 g | PACK | Freq: Every day | ORAL | Status: DC | PRN

## 2024-05-28 NOTE — Plan of Care (Signed)
 Patient disclosed to this provider that he is meeting with his ACT team went well.  Planning for discharge tomorrow morning and patient stated his sister could come and pick him up.  Patient consented to calling his sister [name] to discuss transportation, [phone].  Provider conducted phone call with [name] who had previously noted improvements in her brothers mood during the last conversation, she confirms that she can come pick him up from behavioral health tomorrow (05/30/2023) morning at 10 AM.  PATTI OLDEN, MD [facility] Psych[facility] Resident PGY2

## 2024-05-28 NOTE — Group Note (Signed)
 Date:  05/28/2024 Time:  6:50 PM  Group Topic/Focus:  Making Healthy Choices:   The focus of this group is to help patients identify negative/unhealthy choices they were using prior to admission and identify positive/healthier diet choices to promote brain health.    Participation Level:  Did Not Attend    Juliene CHRISTELLA Huddle 05/28/2024, 6:50 PM

## 2024-05-28 NOTE — Group Note (Signed)
 Date:  05/28/2024 Time:  1:14 PM  Group Topic/Focus: Recreational therapy Word Scramble is a recreational therapy activity that helps adults with mental health conditions improve critical thinking skills. In this activity, participants rearrange scrambled letters to form meaningful words, which encourages problem-solving, decision-making, and concentration. Word Scramble also helps improve attention and memory while reducing stress in a fun and supportive environment. By working individually or in groups, participants practice logical thinking and communication skills, making this activity an effective and engaging tool in recreational therapy.    Participation Level:  Did Not Attend  [name] Fredericks 05/28/2024, 1:14 PM

## 2024-05-28 NOTE — Group Note (Signed)
 Recreation Therapy Group Note   Group Topic:Problem Solving  Group Date: 05/28/2024 Start Time: 0935 End Time: 1005 Facilitators: Kiante Petrovich-McCall, LRT,CTRS Location: [address] Dayroom   Group Topic: Problem Solving  Goal Area(s) Addresses:  Patient will effectively work in a team with other group members. Patient will verbalize importance of using appropriate problem solving techniques.  Patient will identify positive change associated with effective problem solving skills.   Behavioral Response:   Intervention: Worksheet  Activity: Dentist. Patients worked together to complete a front and back worksheet of brain teasers. Patients had to work through different thoughts and ideas to come up with the correct answer. Once patients finished, LRT and patients went over the answers.    Education: Problem solving, Team Building, Communication  Education Outcome: Acknowledges understanding/In group clarification offered/Needs additional education.    Affect/Mood: N/A   Participation Level: Did not attend    Clinical Observations/Individualized Feedback:      Plan: Continue to engage patient in RT group sessions 2-3x/week.   Shannette Tabares-McCall, LRT,CTRS 05/28/2024 1:03 PM

## 2024-05-28 NOTE — Progress Notes (Addendum)
 Brett Wu   Type of Note: Meeting with [facility]  Brett Wu, this writer and patient met in the conference room from 100 PM - 130 PM.   Brett states patient appears much better today compared to meeting last week on 1/8. Reports patient appears mostly back at baseline. Pt was smiling and joking, had forward thinking and was able to identify what led up to hospitalization with better insight.   Brett states ACTT will go to patients home for 7 days straight starting Sunday if discharged tomorrow to ensure he is transitioning well back home after hospitalization.  Pt voiced feeling ready for discharge and looking forward to returning home. Reports his sister is able to pick him up at discharge tomorrow.   MD updated on the above.  Pt would like cell phone to be placed on charger tonight if possible to get the number for his uncle who has house keys and car keys. Reports sister can get them from uncle prior to pick up tomorrow. MD and RN aware.   Signed:  Nakiea Metzner, LCSW-A 05/28/2024  1:46 PM

## 2024-05-28 NOTE — Group Note (Signed)
 Date:  05/28/2024 Time:  9:18 PM  Group Topic/Focus:  Wrap-Up Group:   The focus of this group is to help patients review their daily goal of treatment and discuss progress on daily workbooks.    Participation Level:  None  Participation Quality:  none  Affect:  n.a  Cognitive:  n/a  Insight: None  Engagement in Group:  None  Modes of Intervention:  none  Additional Comments:   Pt attended wrap up group but refused to participate   Delbert Darley A Raegan Sipp 05/28/2024, 9:18 PM

## 2024-05-28 NOTE — Group Note (Signed)
 Date:  05/28/2024 Time:  11:57 AM  Group Topic/Focus: Goals and orientation Goals Group:   The focus of this group is to help patients establish daily goals to achieve during treatment and discuss how the patient can incorporate goal setting into their daily lives to aide in recovery. Orientation:   The focus of this group is to educate the patient on the purpose and policies of crisis stabilization and provide a format to answer questions about their admission.  The group details unit policies and expectations of patients while admitted.    Participation Level:  Did Not Attend   Brett Wu 05/28/2024, 11:57 AM

## 2024-05-28 NOTE — Plan of Care (Addendum)
   Problem: Education: Goal: Emotional status will improve Outcome: Not Progressing Goal: Mental status will improve Outcome: Not Progressing

## 2024-05-28 NOTE — Progress Notes (Signed)
 [facility] Inpatient Psychiatry Progress Note  Date: 05/28/2024 Patient: Brett Wu MRN: [id number]  ASSESSMENT:  Brett Wu is a 29 y.o. male with a past psychiatric history of schizophrenia, methamphetamine use, and cannabis use disorder. Patient initially arrived to [name] on 05/07/24 for increasing auditory hallucinations, delusions, and suicidal ideation and was admitted to [name] under IVC on 05/07/24 for acute safety concerns and likely substance-induced exacerbation of primary psychotic disorder.  05/28/24 Patient still with moments of laying in bed this morning upon initial assessment came back later neuro lunchtime and patient was more amenable to talk.  Discussed the option of possible discharge this weekend and that he had an ACT team meeting today for their assessment.  He denies any significant depression, anxiety, suicidal ideation or auditory visual hallucinations.  Patient did disclose that he is hopeful for discharge and is aware of his ACT team coming to visit him tomorrow afternoon.  Once medically stable for discharge, patient reports he to go to his sisters, he believes she may be able to pick him up or may require taxi to her home.  Continue medications as ordered.  Overall patient has made improvements throughout the week and been engaging further with this provider, previously during his hospitalization the patient with continue to keep his head underneath the covers and would speak very limited amounts of talking.  At this point he has been more engaged, cooperative with our assessments more forthcoming about his emotions.  He has also had improvements with attending groups in the afternoon and to being active.  Patient's mood reactivity is also been improving throughout this hospitalization particularly in the last 3 to 4 days.  As previously stated the patient would not even engage in eye contact with this eye provider prior to last week, now will greet  provider with a handshake and share moments of laughter.  Improvement in symptoms suspect patient may be stable for discharge Saturday or Sunday pending safety disposition close follow-up with his ACT team afterwards.  PLAN:  # Substance-Induced Exacerbation of Primary Psychotic Disorder -- Continue with Zyprexa  5 mg daily and 15 mg at bedtime, to limit oversedation in the day time   - Continue Prozac  to 40 mg daily daily for suspected depressed mood. -- Continue with room lockout during the day, to observe pt's behavior on the milieu    #Nicotine  withdrawal - Patient in need of nicotine  replacement; nicotine  patch 14 mg / 24 hours ordered. Smoking cessation encouraged -- Nicorette  gum 44m g as needed   #Constipation  Continue MiraLAX  Miralax  every day as needed    #Insomnia  Continue with Melatonin 5 mg at bedtime   # PRN's - Atarax  25 mg TID as needed for anxiety - Agitation Protocol: haldol  + ativan  + benadryl     Risk Assessment: Patient continues to require inpatient hospitalization for safety and stabilization of acute psychosis.  Discharge Planning: Barriers to Discharge: safety planning in community, baseline psychiatric status, evaluation  Estimated Length of Stay: Saturday 1/17 or 1/18  Predicted Discharge Location: Personal home  INTERVAL HISTORY: No acute events overnight.   Upon assessment this morning, patient was laying in the bed with his face covered.  Patient eventually engaged with the provider team and stated his mood was okay.  He denies any significant depression or anxiety.  Denies suicidal ideations, homicidal ideation auditory visual hallucinations. Patient does not appear to be responding to internal stimuli on my exam and was appropriate throughout the interaction. Discussed  with the patient that his ACT team would be coming to assess him today.  Patient states he is continue to talk to his sister, and believes he could be discharged to her.  He reports that  she may be able to come pick him up or he would need a taxi voucher.  From there he will coordinate with his uncle to retrieve his keys for his personal residence.  Collateral History 05/26/24 Collateral history collected from the patient's sister, Brett Wu, and her husband. They report that at baseline the patient is calm, chill, and naturally introverted. They note clear improvement recently, stating they can hear it in his voice and that he seems significantly better overall. They shared that the patient's mother passed away around mid-2025, which they believe has had a substantial impact on him. Since her death, he has been more emotionally withdrawn, less communicative, and more depressed. They note he struggles to express his emotions and at times feels depressed without knowing how to cope. However, they report that he is now beginning to express himself more. They also report that the patient expressed interest in seeing his sister and asked about visitation period hospital address and visitation hours were provided.  Physical Examination:  Vitals and nursing note reviewed MSK: Normal range of motion.   MENTAL STATUS EXAM:  Appearance: Dressed appropriately  Behavior: Calm, Sleepy   Attitude: Fine  Speech: Normal rate, rhythm, and volume  Mood: Alright  Affect: Constricted, moments of appropriate laughter  Thought Process: linear, logical, goal-directed  Thought Content: Denies delusions, hallucinations, suicide ideation, or homicidal ideation.   SI/HI: Denies  Perceptions: Denies  Judgement: Fair  Insight: Fair  Fund of Knowledge: WNL   Lab Results:  CBC: WBC 12 CMP: unremarkable UDS: pos for amphetamines Ethanol: <10 TSH: WNL A1c: 5.7% Lipid panel: WNL HIV: negative RPR: Nonreactive   Vitals: Blood pressure 113/62, pulse 72, temperature 98.3 F (36.8 C), temperature source Oral, resp. rate 15, height 5' 10 (1.778 m), weight 62.6 kg, SpO2 99%.   PATTI OLDEN,  MD PGY-2 Psychiatry Resident 05/28/2024, 8:12 AM

## 2024-05-28 NOTE — Plan of Care (Signed)

## 2024-05-28 NOTE — BH Assessment (Signed)
(  Sleep Hours) - 8.75 (Any PRNs that were needed, meds refused, or side effects to meds)-  (Any disturbances and when (visitation, over night)- None (Concerns raised by the patient)- None (SI/HI/AVH)- Denies

## 2024-05-28 NOTE — Group Note (Signed)
 Date:  05/28/2024 Time:  2:55 PM  Group Topic/Focus: Guess that song This song speaks to the quiet exhaustion of adulthood, where responsibilities pile up and emotions are often hidden behind a practiced smile. It reflects the struggle of feeling lost, overwhelmed, and pressured to appear okay while internally battling anxiety and self-doubt. Through honest lyrics and a steady rhythm, the music reminds listeners that its normal to feel broken sometimes--and that asking for help is not a weakness.    Participation Level:  Did Not Attend  Brett Wu 05/28/2024, 2:55 PM

## 2024-05-28 NOTE — Progress Notes (Signed)
 Tour of Duty:  Prentice JINNY Angle, RN, 05/28/24, Tour of Duty: 0700-1900  SI/HI/AVH: Denies  Self-Reported   Mood: Neutral  Anxiety: Denies Depression: Denies, but Observable Irritability: Denies  Broset  Violence Prevention Guidelines *See Row Information*: Small Violence Risk interventions implemented   LBM  Last BM Date : 05/27/24   Pain: not present  Patient Refusals (including Rx): No  >>Shift Summary: Patient observed to be calm but isolative to room. Patient noted to oversleep on unit approximately 1300, patient resistant to verbal prompts to exit room. Patient able to make needs known. Patient observed to engage appropriately with staff and peers, but no initiation. Patient taking medications as prescribed. This shift, PRN medication requested or required. No reported or observed side effects to medication. No reported or observed agitation, aggression, or other acute emotional distress. No reported or observed physical abnormalities or concerns.  Last Vitals  Vitals Weight: 62.6 kg Temp:  (refused nurse notified) Temp Source: Oral Pulse Rate: 84 Resp: 15 BP: 125/73 Patient Position: (not recorded)  Admission Type  Psych Admission Type (Psych Patients Only) Admission Status: Involuntary Date 72 hour document signed : (not recorded) Time 72 hour document signed : (not recorded) Provider Notified (First and Last Name) (see details for LINK to note): (not recorded)   Psychosocial Assessment  Psychosocial Assessment Patient Complaints: None Eye Contact: Avertive Facial Expression: Flat, Sullen Affect: Flat, Sullen Speech: Other (Comment) (paucity) Interaction: Minimal, Superficial Motor Activity: Other (Comment) (WDL) Appearance/Hygiene: Disheveled Behavior Characteristics: Resistant to care Mood: Ambivalent   Aggressive Behavior  Targets: (not recorded)   Thought Process  Thought Process Coherency: Within Defined Limits Content: Within Defined  Limits Delusions: None reported or observed Perception: Within Defined Limits Hallucination: None reported or observed Judgment: Poor Confusion: None  Danger to Self/Others  Danger to Self Current suicidal ideation?: Denies Description of Suicide Plan: (not recorded) Self-Injurious Behavior: (not recorded) Agreement Not to Harm Self: (not recorded) Description of Agreement: (not recorded) Danger to Others: None reported or observed

## 2024-05-29 DIAGNOSIS — F251 Schizoaffective disorder, depressive type: Principal | ICD-10-CM

## 2024-05-29 DIAGNOSIS — F1510 Other stimulant abuse, uncomplicated: Secondary | ICD-10-CM

## 2024-05-29 DIAGNOSIS — F329 Major depressive disorder, single episode, unspecified: Secondary | ICD-10-CM

## 2024-05-29 MED ORDER — OLANZAPINE 7.5 MG PO TABS: 15.0000 mg | ORAL_TABLET | Freq: Every day | ORAL | Status: DC

## 2024-05-29 MED ORDER — MELATONIN 5 MG PO TABS: 5.0000 mg | ORAL_TABLET | Freq: Every day | ORAL | 0 refills | Status: AC

## 2024-05-29 MED ORDER — FLUOXETINE HCL 40 MG PO CAPS: 40.0000 mg | ORAL_CAPSULE | Freq: Every day | ORAL | 0 refills | Status: AC

## 2024-05-29 MED ORDER — NICOTINE POLACRILEX 2 MG MT GUM: 2.0000 mg | CHEWING_GUM | OROMUCOSAL | 0 refills | Status: AC | PRN

## 2024-05-29 MED ORDER — OLANZAPINE 5 MG PO TABS: 5.0000 mg | ORAL_TABLET | Freq: Every day | ORAL | 0 refills | Status: AC

## 2024-05-29 MED ORDER — OLANZAPINE 15 MG PO TABS: 15.0000 mg | ORAL_TABLET | Freq: Every day | ORAL | 0 refills | Status: AC

## 2024-05-29 MED ORDER — HYDROXYZINE HCL 25 MG PO TABS: 25.0000 mg | ORAL_TABLET | Freq: Three times a day (TID) | ORAL | 0 refills | Status: AC | PRN

## 2024-05-29 MED ORDER — OLANZAPINE 5 MG PO TABS: 5.0000 mg | ORAL_TABLET | Freq: Every day | ORAL | Status: DC

## 2024-05-29 NOTE — BH Assessment (Signed)
(  Sleep Hours) - 8 (Any PRNs that were needed, meds refused, or side effects to meds)-  (Any disturbances and when (visitation, over night)- None (Concerns raised by the patient)- [place] out continues. Very isolative. (SI/HI/AVH)- Denies

## 2024-05-29 NOTE — Progress Notes (Signed)
 Patient discharged off unit at 1035. Patient belongings reviewed and acknowledged by patient. AVS and Transition Record reviewed and acknowledged by patient. Safety plan refused by patient, encouragement and education offered by nurse. Any medications and or prescriptions necessary for discharge addressed and provided to patient. Patient denies SI, plan or intent. Denies HI. Denies AVH. No observed or reported side effects to medication. No observed or reported agitation, aggression, or other acute emotional distress. No reported or observed physical abnormalities or concerns. Patient transportation from facility verified and observed.

## 2024-05-29 NOTE — [facility] Suicide Risk Assessment (Signed)
 " [facility] Discharge Suicide Risk Assessment   Principal Problem: Schizophrenia [facility]) Discharge Diagnoses: Active Problems:   Methamphetamine abuse (HCC)   Schizoaffective disorder, depressive type (HCC)  During the patient's hospitalization, patient had extensive initial psychiatric evaluation, and follow-up psychiatric evaluations every day.  Psychiatric diagnoses provided upon initial assessment:  Principal Diagnosis:  Schizophrenia (HCC) Diagnosis:  Principal Problem:   Schizophrenia (HCC)  Patient's psychiatric medications were adjusted on admission:  - Continue Zyprexa  Zydis 10 mg at bedtime  - Started Nicotine  Patch 14 mg daily for nicotine  dependence  - Started hydroxyzine  25 mg 3 times daily as needed for anxiety - Started trazodone  50 mg at bedtime as needed for insomnia   During the hospitalization, other adjustments were made to the patient's psychiatric medication regimen:  - Increased Zyprexa  5 mg daily and 15 mg at bedtime for schizoaffective disorder  - Started Nicorette  Gum 2 mg as needed for nicotine  dependence  - Started Prozac , increased to 40 mg daily for depressive phase of schizoaffective disorder  - Started melatonin 5 mg at bedtime for insomnia -Discontinue trazodone  50 mg daily at bedtime due to oversedation  Patient's care was discussed during the interdisciplinary team meeting every day during the hospitalization.  The patient 11 having side effects to prescribed psychiatric medication.  Gradually, patient started adjusting to milieu.  Patient was initially resistant to care and interaction with the providers and would often times isolate himself in the room.  Closer towards discharge, the patient started to become more interactive, started engaging himself on the milieu and going to groups that the patient deemed helpful.  The patient was evaluated each day by a clinical provider to ascertain response to treatment. Improvement was noted by the patient's report  of decreasing symptoms, improved sleep and appetite, affect, medication tolerance, behavior, and participation in unit programming.  Patient was asked each day to complete a self inventory noting mood, mental status, pain, new symptoms, anxiety and concerns.    Symptoms were reported as significantly decreased or resolved completely by discharge.   On day of discharge, the patient reports that their mood is stable. The patient denied having suicidal thoughts for more than 48 hours prior to discharge.  Patient denies having homicidal thoughts.  Patient denies having auditory hallucinations.  Patient denies any visual hallucinations or other symptoms of psychosis. The patient was motivated to continue taking medication with a goal of continued improvement in mental health.   The patient reports their target psychiatric symptoms of depression, disorganization, withdrawal and self-actualization responded well to the psychiatric medications, and the patient reports overall benefit other psychiatric hospitalization. Supportive psychotherapy was provided to the patient. The patient also participated in regular group therapy while hospitalized. Coping skills, problem solving as well as relaxation therapies were also part of the unit programming.  Labs were reviewed with the patient, and abnormal results were discussed with the patient.  The patient is able to verbalize their individual safety plan to this provider.  # It is recommended to the patient to continue psychiatric medications as prescribed, after discharge from the hospital.    # It is recommended to the patient to follow up with your outpatient psychiatric provider and PCP.  # It was discussed with the patient, the impact of alcohol, drugs, tobacco have been there overall psychiatric and medical wellbeing, and total abstinence from substance use was recommended the patient.ed.  # Prescriptions provided or sent directly to preferred pharmacy at  discharge. Patient agreeable to plan. Given opportunity  to ask questions. Appears to feel comfortable with discharge.    # In the event of worsening symptoms, the patient is instructed to call the crisis hotline, 911 and or go to the nearest ED for appropriate evaluation and treatment of symptoms. To follow-up with primary care provider for other medical issues, concerns and or health care needs  # Patient was discharged to family member  with a plan to follow up as noted below.  Musculoskeletal: Strength & Muscle Tone: within normal limits Gait & Station: normal Patient leans: N/A  Psychiatric Specialty Exam  Presentation  General Appearance: Appropriate for Environment; Disheveled  Eye Contact:Good  Speech:Clear and Coherent  Speech Volume:Normal  Handedness:No data recorded  Mood and Affect  Mood:Euthymic  Duration of Depression Symptoms: No data recorded Affect:Appropriate; Congruent; Other (comment) (Patient with moments of mood reacitivy, smiling and laughing)   Thought Process  Thought Processes:Coherent  Descriptions of Associations:Intact  Orientation:Full (Time, Place and Person)  Thought Content:Logical  History of Schizophrenia/Schizoaffective disorder:No data recorded Duration of Psychotic Symptoms:No data recorded Hallucinations:Hallucinations: None  Ideas of Reference:None  Suicidal Thoughts:Suicidal Thoughts: No  Homicidal Thoughts:Homicidal Thoughts: No   Sensorium  Memory:Immediate Fair; Recent Fair  Judgment:Fair  Insight:Fair   Executive Functions  Concentration:Good  Attention Span:Good  Recall:Good  Fund of Knowledge:Good  Language:Good   Psychomotor Activity  Psychomotor Activity:Psychomotor Activity: Normal   Assets  Assets:Communication Skills; Desire for Improvement; Housing; Social Support; Resilience   Sleep  Sleep:Sleep: Good  Estimated Sleeping Duration (Last 24 Hours): 5.75-7.50 hours  Physical  Exam: Physical Exam Constitutional:      General: He is not in acute distress.    Appearance: He is not ill-appearing, toxic-appearing or diaphoretic.  Pulmonary:     Effort: Pulmonary effort is normal.  Musculoskeletal:        General: Normal range of motion.  Neurological:     Mental Status: He is alert.    Review of Systems  Constitutional:  Negative for chills and fever.  Gastrointestinal:  Negative for nausea and vomiting.   Blood pressure 125/73, pulse 84, temperature 98.3 F (36.8 C), temperature source Oral, resp. rate 15, height 5' 10 (1.778 m), weight 62.6 kg, SpO2 99%. Body mass index is 19.8 kg/m.  Mental Status Per Nursing Assessment::   On Admission:  Suicidal ideation indicated by patient  Demographic Factors:  Male, Adolescent or young adult, Caucasian, and Living alone  Loss Factors: Loss of significant relationship  Historical Factors: Substance use   Risk Reduction Factors:   Positive social support  Continued Clinical Symptoms:  Alcohol/Substance Abuse/Dependencies More than one psychiatric diagnosis Previous Psychiatric Diagnoses and Treatments  Cognitive Features That Contribute To Risk:  None    Suicide Risk:  Patient is a minimal suicide risk, he denies any symptoms of depression, suicidal ideations, no prior history of suicide attempts, he is currently followed by ACT team who is extremely supportive and also has supportive family members nearby.  Patient is motivated to continue on medications and is able to communicate needs to providers.   Follow-up Information     [facility]. Call.   Why: Please follow up with your provider for ACTT services to continue with therapy and medication management. Contact information: [facility] [address], [place] [address] Phone:  [phone]                Plan Of Care/Follow-up recommendations:   Activity: as tolerated  Diet:  Regular  Other: -Follow-up with your  outpatient psychiatric provider -instructions on appointment date, time, and address (location) are provided to you in discharge paperwork.  -Take your psychiatric medications as prescribed at discharge - instructions are provided to you in the discharge paperwork  -Follow-up with outpatient primary care doctor and other specialists -for management of preventative medicine and chronic medical disease  -Testing: Follow-up with outpatient provider for abnormal lab results:  A1c 5.7  -If you are prescribed an atypical antipsychotic medication, we recommend that your outpatient psychiatrist follow routine screening for side effects within 3 months of discharge, including monitoring: AIMS scale, height, weight, blood pressure, fasting lipid panel, HbA1c, and fasting blood sugar.   -Recommend total abstinence from alcohol, tobacco, and other illicit drug use at discharge.   -If your psychiatric symptoms recur, worsen, or if you have side effects to your psychiatric medications, call your outpatient psychiatric provider, 911, [phone] or go to the nearest emergency department.  -If suicidal thoughts occur, immediately call your outpatient psychiatric provider, 911, [phone] or go to the nearest emergency department.   PATTI OLDEN, MD 05/29/2024, 9:13 AM "

## 2024-05-29 NOTE — Discharge Summary (Addendum)
 " Physician Discharge Summary Note  Patient:  Brett Wu is an 29 y.o., male MRN:  [id number] DOB:  September 01, 1995 Patient phone:  [phone] (home)  Patient address:   [address] [place] [address],   Date of Admission:  05/07/2024 Date of Discharge: 05/29/2024  Reason for Admission:    HPI: Brett Wu is a 29 y.o. male  with a past psychiatric history of schizophrenia, methamphetamine use, and cannabis use disorder. Patient initially arrived to [facility] on 05/07/24 for increasing auditory hallucinations, delusions, and suicidal ideation and was admitted to [facility] under IVC on 05/07/24 for acute safety concerns and severe substance-induced psychosis or mood disturbances. No significant past medical history.   Despite attempts to interview the patient on two separate occasions, he remained too somnolent to assess and would not respond to verbal stimuli. According to nursing staff, patient has been sleeping throughout the day. No acute safety concerns noted by staff.   Per chart review: Patient was IVC'd by his peer support specialist from his ACT team out of concern for worsening auditory hallucinations, delusions, and suicidal ideation. He called her around 2:30 AM yesterday morning stating that he was not feeling well and expressing a desire to die. She noted that the patient has been feeling overwhelmed due to being behind on his rent. Although patient had previously expressed suicidal thoughts to family members, he denied this during ED assessment, stating he would not harm himself out of fear of not being forgiven. Patient admits to using methamphetamine for a while and states his last use was the other day. His home medications include Zyprexa , Klonopin and Topamax. Unclear if he was been compliant with medications.  Principal Problem: Schizophrenia [facility]) Discharge Diagnoses: Active Problems:   Methamphetamine abuse (HCC)   Schizoaffective disorder, depressive  type [facility])  Past Psychiatric History:  Current psychiatrist: Dr. Malinda; follows with [facility] ACT team Current therapist: Yes, peer support Previous psychiatric diagnoses: schizophrenia, methamphetamine use disorder, cannabis use disorder Psychiatric Medications:             -Home Meds: olanzapine  ODT 15 mg at bedtime (has been non-adherent), clonazepam, Topamax             -Past Med Trials: Unknown Psychiatric hospitalization(s): Yes Psychotherapy history: Peer support with ACT team Neuromodulation history: No History of suicide (obtained from HPI): Unknown History of homicide or aggression (obtained in HPI): Unknown NSSIB: Unknown  Past Medical History: History reviewed. No pertinent past medical history.  Past Surgical History:  Procedure Laterality Date   CYST EXCISION N/A 10/23/2019   Procedure: I&D Facial abscess;  Surgeon: Mable Lenis, MD;  Location: [facility];  Service: ENT;  Laterality: N/A;   Family History:  Family History  Problem Relation Age of Onset   Other Neg Hx    Family Psychiatric  History:  Mother has a history of bipolar disorder.  Social History:  Social History   Substance and Sexual Activity  Alcohol Use Not Currently   Comment: 2-3 times a week. Last drink: today      Social History   Substance and Sexual Activity  Drug Use Yes   Types: Marijuana   Comment: Last used: 1 week ago     Social History   Socioeconomic History   Marital status: Single    Spouse name: Not on file   Number of children: Not on file   Years of education: Not on file   Highest education level: Not on file  Occupational History   Not on file  Tobacco Use   Smoking status: Every Day    Current packs/day: 0.50    Types: Cigarettes   Smokeless tobacco: Never   Tobacco comments:     A lot   Vaping Use   Vaping status: Every Day  Substance and Sexual Activity   Alcohol use: Not Currently    Comment: 2-3 times a week. Last drink: today    Drug  use: Yes    Types: Marijuana    Comment: Last used: 1 week ago    Sexual activity: Not on file  Other Topics Concern   Not on file  Social History Narrative   Not on file   Social Drivers of Health   Tobacco Use: High Risk (05/07/2024)   Patient History    Smoking Tobacco Use: Every Day    Smokeless Tobacco Use: Never    Passive Exposure: Not on file  Financial Resource Strain: Not on file  Food Insecurity: Food Insecurity Present (05/07/2024)   Epic    Worried About Programme Researcher, Broadcasting/film/video in the Last Year: Sometimes true    Ran Out of Food in the Last Year: Sometimes true  Transportation Needs: Patient Unable To Answer (05/07/2024)   Epic    Lack of Transportation (Medical): Patient unable to answer    Lack of Transportation (Non-Medical): Patient unable to answer  Physical Activity: Not on file  Stress: Not on file  Social Connections: Not on file  Depression (EYV7-0): Not on file  Alcohol Screen: Low Risk (05/07/2024)   Alcohol Screen    Last Alcohol Screening Score (AUDIT): 2  Housing: Low Risk (05/07/2024)   Epic    Unable to Pay for Housing in the Last Year: No    Number of Times Moved in the Last Year: 0    Homeless in the Last Year: No  Utilities: Not At Risk (05/07/2024)   Epic    Threatened with loss of utilities: No  Health Literacy: Not on file   Hospital Course:    During the patient's hospitalization, patient had extensive initial psychiatric evaluation, and follow-up psychiatric evaluations every day.   Psychiatric diagnoses provided upon initial assessment:  Principal Diagnosis:  Schizophrenia (HCC) Diagnosis:  Principal Problem:   Schizophrenia (HCC)   Patient's psychiatric medications were adjusted on admission:  - Continue Zyprexa  Zydis 10 mg at bedtime  - Started Nicotine  Patch 14 mg daily for nicotine  dependence  - Started hydroxyzine  25 mg 3 times daily as needed for anxiety - Started trazodone  50 mg at bedtime as needed for insomnia     During the hospitalization, other adjustments were made to the patient's psychiatric medication regimen:  - Increased Zyprexa  5 mg daily and 15 mg at bedtime for schizoaffective disorder  - Started Nicorette  Gum 2 mg as needed for nicotine  dependence  - Started Prozac , increased to 40 mg daily for depressive phase of schizoaffective disorder  - Started melatonin 5 mg at bedtime for insomnia -Discontinue trazodone  50 mg daily at bedtime due to oversedation   Patient's care was discussed during the interdisciplinary team meeting every day during the hospitalization.   The patient 9 having side effects to prescribed psychiatric medication.   Gradually, patient started adjusting to milieu.  Patient was initially resistant to care and interaction with the providers and would often times isolate himself in the room.  Closer towards discharge, the patient started to become more interactive, started engaging himself on the milieu and going  to groups that the patient deemed helpful.  The patient was evaluated each day by a clinical provider to ascertain response to treatment. Improvement was noted by the patient's report of decreasing symptoms, improved sleep and appetite, affect, medication tolerance, behavior, and participation in unit programming.  Patient was asked each day to complete a self inventory noting mood, mental status, pain, new symptoms, anxiety and concerns.     Symptoms were reported as significantly decreased or resolved completely by discharge.    On day of discharge, the patient reports that their mood is stable. The patient denied having suicidal thoughts for more than 48 hours prior to discharge.  Patient denies having homicidal thoughts.  Patient denies having auditory hallucinations.  Patient denies any visual hallucinations or other symptoms of psychosis. The patient was motivated to continue taking medication with a goal of continued improvement in mental health.    The patient  reports their target psychiatric symptoms of depression, disorganization, withdrawal and self-actualization responded well to the psychiatric medications, and the patient reports overall benefit other psychiatric hospitalization. Supportive psychotherapy was provided to the patient. The patient also participated in regular group therapy while hospitalized. Coping skills, problem solving as well as relaxation therapies were also part of the unit programming.   Labs were reviewed with the patient, and abnormal results were discussed with the patient.   The patient is able to verbalize their individual safety plan to this provider.   # It is recommended to the patient to continue psychiatric medications as prescribed, after discharge from the hospital.     # It is recommended to the patient to follow up with your outpatient psychiatric provider and PCP.   # It was discussed with the patient, the impact of alcohol, drugs, tobacco have been there overall psychiatric and medical wellbeing, and total abstinence from substance use was recommended the patient.ed.   # Prescriptions provided or sent directly to preferred pharmacy at discharge. Patient agreeable to plan. Given opportunity to ask questions. Appears to feel comfortable with discharge.    # In the event of worsening symptoms, the patient is instructed to call the crisis hotline, 911 and or go to the nearest ED for appropriate evaluation and treatment of symptoms. To follow-up with primary care provider for other medical issues, concerns and or health care needs   # Patient was discharged to family member  with a plan to follow up as noted below.    Physical Findings: AIMS:  , ,  ,  ,  ,  ,   AIMS score zero. No EPS observed  CIWA:    COWS:      Musculoskeletal: Strength & Muscle Tone: within normal limits Gait & Station: normal Patient leans: N/A   Psychiatric Specialty Exam   Presentation  General Appearance: Appropriate for  Environment; Disheveled   Eye Contact:Good   Speech:Clear and Coherent   Speech Volume:Normal   Handedness:No data recorded   Mood and Affect  Mood:Euthymic   Duration of Depression Symptoms: No data recorded Affect:Appropriate; Congruent; Other (comment) (Patient with moments of mood reacitivy, smiling and laughing)     Thought Process  Thought Processes:Coherent   Descriptions of Associations:Intact   Orientation:Full (Time, Place and Person)   Thought Content:Logical   History of Schizophrenia/Schizoaffective disorder:No data recorded Duration of Psychotic Symptoms:No data recorded Hallucinations:Hallucinations: None   Ideas of Reference:None   Suicidal Thoughts:Suicidal Thoughts: No   Homicidal Thoughts:Homicidal Thoughts: No     Sensorium  Memory:Immediate Fair; Recent Fair  Judgment:Fair   Insight:Fair     Executive Functions  Concentration:Good   Attention Span:Good   Recall:Good   Fund of Knowledge:Good   Language:Good     Psychomotor Activity  Psychomotor Activity:Psychomotor Activity: Normal     Assets  Assets:Communication Skills; Desire for Improvement; Housing; Social Support; Resilience     Sleep  Sleep:Sleep: Good   Estimated Patient Durations  Estimated Sleeping Duration (Last 24 Hours): 5.75-7.50 hours     Physical Exam: Physical Exam Constitutional:      General: He is not in acute distress.    Appearance: He is not ill-appearing, toxic-appearing or diaphoretic.  Pulmonary:     Effort: Pulmonary effort is normal.  Musculoskeletal:        General: Normal range of motion.  Neurological:     Mental Status: He is alert.     Review of Systems  Constitutional:  Negative for chills and fever.  Gastrointestinal:  Negative for nausea and vomiting.  Blood pressure 125/73, pulse 84, temperature 98.3 F (36.8 C), temperature source Oral, resp. rate 15, height 5' 10 (1.778 m), weight 62.6 kg, SpO2 99%. Body mass index  is 19.8 kg/m.   Tobacco Use History[1] Tobacco Cessation:  A prescription for an FDA-approved tobacco cessation medication provided at discharge   Blood Alcohol level:  Lab Results  Component Value Date   [facility] <15 05/07/2024   ETH <10 04/30/2019    Metabolic Disorder Labs:  Lab Results  Component Value Date   HGBA1C 5.7 (H) 05/12/2024   MPG 116.89 05/12/2024   No results found for: PROLACTIN Lab Results  Component Value Date   CHOL 142 05/12/2024   TRIG 83 05/12/2024   HDL 44 05/12/2024   CHOLHDL 3.2 05/12/2024   VLDL 17 05/12/2024   LDLCALC 82 05/12/2024    See Psychiatric Specialty Exam and Suicide Risk Assessment completed by Attending Physician prior to discharge.  Discharge destination:  Other:  Sister, who will take him to his residence   Is patient on multiple antipsychotic therapies at discharge:  No   Has Patient had three or more failed trials of antipsychotic monotherapy by history:  No  Recommended Plan for Multiple Antipsychotic Therapies: NA   Allergies as of 05/29/2024   No Known Allergies      Medication List     TAKE these medications      Indication  FLUoxetine  40 MG capsule Commonly known as: PROZAC  Take 1 capsule (40 mg total) by mouth daily.  Indication: Depression   hydrOXYzine  25 MG tablet Commonly known as: ATARAX  Take 1 tablet (25 mg total) by mouth 3 (three) times daily as needed for anxiety.  Indication: Feeling Anxious   melatonin 5 MG Tabs Take 1 tablet (5 mg total) by mouth at bedtime.  Indication: Trouble Sleeping   nicotine  polacrilex 2 MG gum Commonly known as: NICORETTE  Take 1 each (2 mg total) by mouth as needed for smoking cessation.  Indication: Nicotine  Addiction   OLANZapine  15 MG tablet Commonly known as: ZYPREXA  Take 1 tablet (15 mg total) by mouth at bedtime.  Indication: Schizophrenia   OLANZapine  5 MG tablet Commonly known as: ZYPREXA  Take 1 tablet (5 mg total) by mouth daily. Start taking on:  May 30, 2024  Indication: Schizophrenia        Follow-up Information     [facility]. Call.   Why: Please follow up with your provider for ACTT services to continue with therapy and medication management. Contact information: [facility] (  [address] [place], [place] [address] Phone:  [phone]                Follow-up recommendations:  Activity: as tolerated   Diet: Regular   Other: -Follow-up with your outpatient psychiatric provider -instructions on appointment date, time, and address (location) are provided to you in discharge paperwork.   -Take your psychiatric medications as prescribed at discharge - instructions are provided to you in the discharge paperwork   -Follow-up with outpatient primary care doctor and other specialists -for management of preventative medicine and chronic medical disease   -Testing: Follow-up with outpatient provider for abnormal lab results:  A1c 5.7   -If you are prescribed an atypical antipsychotic medication, we recommend that your outpatient psychiatrist follow routine screening for side effects within 3 months of discharge, including monitoring: AIMS scale, height, weight, blood pressure, fasting lipid panel, HbA1c, and fasting blood sugar.    -Recommend total abstinence from alcohol, tobacco, and other illicit drug use at discharge.    -If your psychiatric symptoms recur, worsen, or if you have side effects to your psychiatric medications, call your outpatient psychiatric provider, 911, 988 or go to the nearest emergency department.   -If suicidal thoughts occur, immediately call your outpatient psychiatric provider, 911, 988 or go to the nearest emergency department.  Signed: PATTI OLDEN, MD 05/29/2024, 11:32 AM           [1]  Social History Tobacco Use  Smoking Status Every Day   Current packs/day: 0.50   Types: Cigarettes  Smokeless Tobacco Never   Tobacco Comments    A lot    "

## 2024-05-29 NOTE — Progress Notes (Signed)
" °  [facility] Adult Case Management Discharge Plan :  Will you be returning to the same living situation after discharge:  Yes,  patient's sister will pick patient uo At discharge, do you have transportation home?: Yes,  patient's sister [name] Has you have the ability to pay for your medications: Yes,  patient has insurance to cover medications  Release of information consent forms completed and in the chart;  Patient's signature needed at discharge.  Patient to Follow up at:  Follow-up Information     [facility]. Call.   Why: Please follow up with your provider for ACTT services to continue with therapy and medication management. Contact information: [facility] ([facility] [address] [place], [place] [address] Phone:  [phone]                Next level of care provider has access to [facility] Link:no  Safety Planning and Suicide Prevention discussed: Yes,  completed with patient on 05/29/24     Has patient been referred to the Quitline?: Patient refused referral for treatment  Patient has been referred for addiction treatment: Yes, referral information given but appointment not made Patient will follow up with [facility] for continued therapy and medication management (list facility).  [address] [place], [place] 05/29/2024, 9:24 AM "

## 2024-05-29 NOTE — [facility] Suicide Risk Assessment (Addendum)
"   [facility] INPATIENT:  Family/Significant Other Suicide Prevention Education  Suicide Prevention Education:   Suicide Prevention Education was reviewed thoroughly with patient, including risk factors, warning signs, and what to do.  Mobile Crisis services were described and that telephone number pointed out, with encouragement to patient to put this number in personal cell phone.  Brochure was provided to patient to share with [facility].  Patient acknowledged the ways in which they are at risk, and how working through each of their issues can gradually start to reduce their risk factors.  Patient was encouraged to think of the information in the context of people in their own lives.  Patient denied having access to firearms  Patient verbalized understanding of information provided.  Patient endorsed a desire to live    [employer], LCSW Licensed Clinical Social Worker      "
